# Patient Record
Sex: Male | Born: 1970 | Race: White | Hispanic: No | Marital: Married | Smoking: Never smoker
Health system: Southern US, Community
[De-identification: ages and names within clinical notes are randomized; demographics above are authoritative.]

## PROBLEM LIST (undated history)

## (undated) DIAGNOSIS — U071 COVID-19: Secondary | ICD-10-CM

## (undated) HISTORY — DX: COVID-19: U07.1

---

## 2005-10-31 HISTORY — PX: ERCP W/ SPHINCTEROTOMY AND BALLOON DILATION: SHX1524

## 2006-06-30 ENCOUNTER — Ambulatory Visit (HOSPITAL_COMMUNITY): Admission: RE | Admit: 2006-06-30 | Discharge: 2006-06-30 | Payer: Self-pay | Admitting: Gastroenterology

## 2006-07-17 ENCOUNTER — Ambulatory Visit (HOSPITAL_COMMUNITY): Admission: RE | Admit: 2006-07-17 | Discharge: 2006-07-17 | Payer: Self-pay | Admitting: *Deleted

## 2006-08-11 ENCOUNTER — Ambulatory Visit: Payer: Self-pay | Admitting: Internal Medicine

## 2006-08-28 LAB — CBC WITH DIFFERENTIAL/PLATELET
BASO%: 0.3 % (ref 0.0–2.0)
Basophils Absolute: 0 10*3/uL (ref 0.0–0.1)
Eosinophils Absolute: 0.1 10*3/uL (ref 0.0–0.5)
HCT: 39 % (ref 38.7–49.9)
HGB: 14.2 g/dL (ref 13.0–17.1)
LYMPH%: 21.9 % (ref 14.0–48.0)
MCHC: 36.5 g/dL — ABNORMAL HIGH (ref 32.0–35.9)
MONO#: 0.5 10*3/uL (ref 0.1–0.9)
NEUT%: 69.7 % (ref 40.0–75.0)
Platelets: 162 10*3/uL (ref 145–400)
WBC: 7.4 10*3/uL (ref 4.0–10.0)
lymph#: 1.6 10*3/uL (ref 0.9–3.3)

## 2006-08-29 LAB — COMPREHENSIVE METABOLIC PANEL
ALT: 104 U/L — ABNORMAL HIGH (ref 0–40)
BUN: 8 mg/dL (ref 6–23)
CO2: 27 mEq/L (ref 19–32)
Calcium: 9 mg/dL (ref 8.4–10.5)
Chloride: 102 mEq/L (ref 96–112)
Creatinine, Ser: 0.94 mg/dL (ref 0.40–1.50)
Glucose, Bld: 102 mg/dL — ABNORMAL HIGH (ref 70–99)
Total Bilirubin: 3.4 mg/dL — ABNORMAL HIGH (ref 0.3–1.2)

## 2006-08-29 LAB — IRON AND TIBC
%SAT: 53 % (ref 20–55)
Iron: 147 ug/dL (ref 42–165)
TIBC: 279 ug/dL (ref 215–435)

## 2006-08-29 LAB — LACTATE DEHYDROGENASE: LDH: 196 U/L (ref 94–250)

## 2006-08-29 LAB — FOLATE RBC: RBC Folate: 1104 ng/mL — ABNORMAL HIGH (ref 180–600)

## 2019-09-09 ENCOUNTER — Telehealth: Payer: Self-pay | Admitting: Emergency Medicine

## 2019-09-10 ENCOUNTER — Encounter (HOSPITAL_COMMUNITY): Payer: Self-pay

## 2019-09-10 ENCOUNTER — Other Ambulatory Visit: Payer: Self-pay

## 2019-09-10 ENCOUNTER — Inpatient Hospital Stay (HOSPITAL_COMMUNITY): Payer: HRSA Program

## 2019-09-10 ENCOUNTER — Inpatient Hospital Stay (HOSPITAL_COMMUNITY)
Admission: AD | Admit: 2019-09-10 | Discharge: 2019-09-15 | DRG: 177 | Disposition: A | Discharge status: Home / Self Care | Payer: HRSA Program | Source: Other Acute Inpatient Hospital | Attending: Family Medicine | Admitting: Family Medicine

## 2019-09-10 DIAGNOSIS — R0600 Dyspnea, unspecified: Secondary | ICD-10-CM

## 2019-09-10 DIAGNOSIS — J9601 Acute respiratory failure with hypoxia: Secondary | ICD-10-CM | POA: Diagnosis present

## 2019-09-10 DIAGNOSIS — D580 Hereditary spherocytosis: Secondary | ICD-10-CM | POA: Diagnosis present

## 2019-09-10 DIAGNOSIS — R0603 Acute respiratory distress: Secondary | ICD-10-CM | POA: Diagnosis not present

## 2019-09-10 DIAGNOSIS — I2699 Other pulmonary embolism without acute cor pulmonale: Secondary | ICD-10-CM | POA: Diagnosis not present

## 2019-09-10 DIAGNOSIS — J1289 Other viral pneumonia: Secondary | ICD-10-CM

## 2019-09-10 DIAGNOSIS — U071 COVID-19: Secondary | ICD-10-CM | POA: Diagnosis present

## 2019-09-10 DIAGNOSIS — R739 Hyperglycemia, unspecified: Secondary | ICD-10-CM | POA: Diagnosis present

## 2019-09-10 DIAGNOSIS — J1282 Pneumonia due to coronavirus disease 2019: Secondary | ICD-10-CM | POA: Diagnosis present

## 2019-09-10 DIAGNOSIS — E876 Hypokalemia: Secondary | ICD-10-CM | POA: Diagnosis present

## 2019-09-10 LAB — PROCALCITONIN: Procalcitonin: 0.22 ng/mL

## 2019-09-10 LAB — COMPREHENSIVE METABOLIC PANEL
ALT: 28 U/L (ref 0–44)
AST: 34 U/L (ref 15–41)
Albumin: 3.2 g/dL — ABNORMAL LOW (ref 3.5–5.0)
Alkaline Phosphatase: 75 U/L (ref 38–126)
Anion gap: 10 (ref 5–15)
BUN: 24 mg/dL — ABNORMAL HIGH (ref 6–20)
CO2: 29 mmol/L (ref 22–32)
Calcium: 8.1 mg/dL — ABNORMAL LOW (ref 8.9–10.3)
Chloride: 98 mmol/L (ref 98–111)
Creatinine, Ser: 0.83 mg/dL (ref 0.61–1.24)
GFR calc Af Amer: >60 mL/min (ref >60)
GFR calc non Af Amer: >60 mL/min (ref >60)
Glucose, Bld: 194 mg/dL — ABNORMAL HIGH (ref 70–99)
Potassium: 3.2 mmol/L — ABNORMAL LOW (ref 3.5–5.1)
Sodium: 137 mmol/L (ref 135–145)
Total Bilirubin: 1.5 mg/dL — ABNORMAL HIGH (ref 0.3–1.2)
Total Protein: 6.2 g/dL — ABNORMAL LOW (ref 6.5–8.1)

## 2019-09-10 LAB — CBC WITH DIFFERENTIAL/PLATELET
Abs Immature Granulocytes: 0.83 10*3/uL — ABNORMAL HIGH (ref 0.00–0.07)
Basophils Absolute: 0.1 10*3/uL (ref 0.0–0.1)
Basophils Relative: 0 %
Eosinophils Absolute: 0 10*3/uL (ref 0.0–0.5)
Eosinophils Relative: 0 %
HCT: 33.7 % — ABNORMAL LOW (ref 39.0–52.0)
Hemoglobin: 11.5 g/dL — ABNORMAL LOW (ref 13.0–17.0)
Immature Granulocytes: 4 %
Lymphocytes Relative: 6 %
Lymphs Abs: 1.2 10*3/uL (ref 0.7–4.0)
MCH: 31.7 pg (ref 26.0–34.0)
MCHC: 34.1 g/dL (ref 30.0–36.0)
MCV: 92.8 fL (ref 80.0–100.0)
Monocytes Absolute: 0.8 10*3/uL (ref 0.1–1.0)
Monocytes Relative: 4 %
Neutro Abs: 18.2 10*3/uL — ABNORMAL HIGH (ref 1.7–7.7)
Neutrophils Relative %: 86 %
Platelets: 278 10*3/uL (ref 150–400)
RBC: 3.63 MIL/uL — ABNORMAL LOW (ref 4.22–5.81)
RDW: 15.7 % — ABNORMAL HIGH (ref 11.5–15.5)
WBC: 21.1 10*3/uL — ABNORMAL HIGH (ref 4.0–10.5)
nRBC: 1.5 % — ABNORMAL HIGH (ref 0.0–0.2)

## 2019-09-10 LAB — HIV ANTIBODY (ROUTINE TESTING W REFLEX): HIV Screen 4th Generation wRfx: NONREACTIVE

## 2019-09-10 LAB — MRSA PCR SCREENING: MRSA by PCR: NEGATIVE

## 2019-09-10 LAB — ABO/RH: ABO/RH(D): O POS

## 2019-09-10 MED ORDER — ACETAMINOPHEN 650 MG RE SUPP: 650.0000 mg | Freq: Four times a day (QID) | RECTAL | Status: DC | PRN

## 2019-09-10 MED ORDER — CHLORHEXIDINE GLUCONATE CLOTH 2 % EX PADS
6.0000 | MEDICATED_PAD | Freq: Every day | CUTANEOUS | Status: DC
  Administered 2019-09-10 – 2019-09-15 (×5): 6 via TOPICAL

## 2019-09-10 MED ORDER — ONDANSETRON HCL 4 MG/2ML IJ SOLN: 4.0000 mg | Freq: Four times a day (QID) | INTRAMUSCULAR | Status: DC | PRN

## 2019-09-10 MED ORDER — ONDANSETRON HCL 4 MG PO TABS: 4.0000 mg | ORAL_TABLET | Freq: Four times a day (QID) | ORAL | Status: DC | PRN

## 2019-09-10 MED ORDER — ACETAMINOPHEN 325 MG PO TABS: 650.0000 mg | ORAL_TABLET | Freq: Four times a day (QID) | ORAL | Status: DC | PRN

## 2019-09-10 MED ORDER — ENOXAPARIN SODIUM 30 MG/0.3ML ~~LOC~~ SOLN: 30.0000 mg | SUBCUTANEOUS | Status: DC

## 2019-09-10 MED ORDER — SODIUM CHLORIDE 0.9 % IV SOLN
100.0000 mg | INTRAVENOUS | Status: AC
  Administered 2019-09-11 – 2019-09-13 (×3): 100 mg via INTRAVENOUS
  Filled 2019-09-10: qty 100
  Filled 2019-09-10 (×2): qty 20

## 2019-09-10 MED ORDER — HYDROCOD POLST-CPM POLST ER 10-8 MG/5ML PO SUER
5.0000 mL | Freq: Two times a day (BID) | ORAL | Status: DC
  Administered 2019-09-10 – 2019-09-11 (×2): 5 mL via ORAL
  Filled 2019-09-10 (×5): qty 5

## 2019-09-10 MED ORDER — GUAIFENESIN-DM 100-10 MG/5ML PO SYRP
10.0000 mL | ORAL_SOLUTION | ORAL | Status: DC | PRN
  Administered 2019-09-11 (×2): 10 mL via ORAL
  Filled 2019-09-10 (×2): qty 10

## 2019-09-10 MED ORDER — DEXAMETHASONE SODIUM PHOSPHATE 10 MG/ML IJ SOLN
6.0000 mg | INTRAMUSCULAR | Status: DC
  Administered 2019-09-10 – 2019-09-14 (×5): 6 mg via INTRAVENOUS
  Filled 2019-09-10 (×6): qty 1

## 2019-09-10 MED ORDER — AZITHROMYCIN 500 MG PO TABS
500.0000 mg | ORAL_TABLET | Freq: Every day | ORAL | Status: DC
  Administered 2019-09-10: 500 mg via ORAL
  Filled 2019-09-10 (×2): qty 1

## 2019-09-10 MED ORDER — SODIUM CHLORIDE 0.9 % IV SOLN
100.0000 mg | Freq: Once | INTRAVENOUS | Status: AC
  Administered 2019-09-10: 100 mg via INTRAVENOUS
  Filled 2019-09-10: qty 20

## 2019-09-10 MED ORDER — SODIUM CHLORIDE 0.9 % IV SOLN
2.0000 g | INTRAVENOUS | Status: DC
  Administered 2019-09-10: 2 g via INTRAVENOUS
  Filled 2019-09-10: qty 20

## 2019-09-10 MED ORDER — ZINC SULFATE 220 (50 ZN) MG PO CAPS
220.0000 mg | ORAL_CAPSULE | Freq: Every day | ORAL | Status: DC
  Administered 2019-09-10 – 2019-09-15 (×6): 220 mg via ORAL
  Filled 2019-09-10 (×6): qty 1

## 2019-09-10 MED ORDER — VITAMIN C 500 MG PO TABS
500.0000 mg | ORAL_TABLET | Freq: Every day | ORAL | Status: DC
  Administered 2019-09-10 – 2019-09-15 (×6): 500 mg via ORAL
  Filled 2019-09-10 (×6): qty 1

## 2019-09-10 NOTE — Progress Notes (Signed)
Pt was tx from [place] for [place]. He received his loading dose of remdesivir there yesterday at 1500. D/w Dr. Cathlean Sauer so that we can continue it here.   Baseline ALT 29 11/9 Baseline scr 0.7 11/9  Continue remdesivir 100mg  IV q24 x4 F/u ALT   Onnie Boer, PharmD, BCIDP, AAHIVP, CPP Infectious Disease Pharmacist 09/10/2019 3:37 PM

## 2019-09-10 NOTE — Progress Notes (Signed)
Noted procalcitonin 0.22, will dc antibiotic therapy.

## 2019-09-10 NOTE — H&P (Signed)
History and Physical    Calvin Arnold DOB: 1971/08/14 DOA: 09/10/2019  PCP: No primary care provider on file.   Patient coming from: Home/ transfer from [facility]  Chief Complaint:  Dyspnea.   HPI: Calvin Arnold is a 48 y.o. male  who as no significant past medical history. He presented to [facility] due to persistent dyspnea and not feeling well.  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 He is married and has no children.  On October 29 he developed a sore throat and severe cough which have been persistent, his symptoms progressed to dyspnea, malaise, poor appetite and generalized weakness.  He has been experiencing fevers every day.  His dyspnea was initially mainly on exertion, and progressed to be with minimal efforts, it has been associated with cough, no improving, and no worsening factors.  He tested positive for COVID-19 November 3 and he was advised to stay at home and self quarantine.  Due to persistent symptoms he presented to the hospital for further evaluation.  ED Course: Patient was evaluated at [facility] emergency department, his oxygen saturation in triage was 50%, he was tachycardic, febrile and tachypneic with a respiratory rate of 40 to 44/min.  He was placed on nonrebreather mask 100% FiO2 with improvement of oxygen saturation up to 90%.  He tested positive for COVID-19.  He received dexamethasone, remdesivir and antibiotics.  He has been transferred to [facility] for further management.  Review of Systems:  1. General: positive fever, chills, decreased appetite, generalized malaise. 2. ENT: Positive sore throat, no hearing disturbances 3. Pulmonary: Positive cough and dyspnea as mentioned in history of present illness, no wheezing, or hemoptysis 4. Cardiovascular: No angina, claudication, lower extremity edema, pnd or orthopnea 5. Gastrointestinal: No nausea or vomiting, no diarrhea or constipation 6.  Hematology: No easy bruisability or frequent infections 7. Urology: No dysuria, hematuria or increased urinary frequency 8. Dermatology: No rashes. 9. Neurology: No seizures or paresthesias 10. Musculoskeletal: No joint pain or deformities  No significant past medical history.   has no history on file for tobacco, alcohol, and drug.  No Known Allergies  Positive family history for heart disease in his grandfather.  Prior to Admission medications   Not on File    Physical Exam: Vitals:   09/10/19 1500 09/10/19 1515 09/10/19 1530 09/10/19 1600  BP: 125/76 124/73 135/71 127/75  Pulse: 84 82 77 76  Resp: (!) 31 (!) 34 (!) 26 (!) 32  Temp: 97.7 F (36.5 C)     TempSrc: Axillary     SpO2: 100% 100% 96% 98%  Weight: 71.8 kg     Height: 5' 7.5" (1.715 m)       Vitals:   09/10/19 1500 09/10/19 1515 09/10/19 1530 09/10/19 1600  BP: 125/76 124/73 135/71 127/75  Pulse: 84 82 77 76  Resp: (!) 31 (!) 34 (!) 26 (!) 32  Temp: 97.7 F (36.5 C)     TempSrc: Axillary     SpO2: 100% 100% 96% 98%  Weight: 71.8 kg     Height: 5' 7.5" (1.715 m)      General: positive dyspnea, deconditioned  Neurology: Awake and alert, non focal Head and Neck. Head normocephalic. Neck supple with no adenopathy or thyromegaly.   E ENI:DPOE pallor, no icterus, oral mucosa moist Cardiovascular: No JVD. S1-S2 present, rhythmic, no gallops, rubs, or murmurs. No lower extremity edema. Pulmonary: positive breath sounds bilaterally, no wheezing, rhonchi  or rales. Gastrointestinal. Abdomen with no organomegaly, non tender, no rebound or guarding Skin. No rashes Musculoskeletal: no joint deformities    Labs on Admission: I have personally reviewed following labs and imaging studies  CBC: No results for input(s): WBC, NEUTROABS, HGB, HCT, MCV, PLT in the last 168 hours. Basic Metabolic Panel: No results for input(s): NA, K, CL, CO2, GLUCOSE, BUN, CREATININE, CALCIUM, MG, PHOS in the last 168 hours. GFR:  CrCl cannot be calculated (Patient's most recent lab result is older than the maximum 21 days allowed.). Liver Function Tests: No results for input(s): AST, ALT, ALKPHOS, BILITOT, PROT, ALBUMIN in the last 168 hours. No results for input(s): LIPASE, AMYLASE in the last 168 hours. No results for input(s): AMMONIA in the last 168 hours. Coagulation Profile: No results for input(s): INR, PROTIME in the last 168 hours. Cardiac Enzymes: No results for input(s): CKTOTAL, CKMB, CKMBINDEX, TROPONINI in the last 168 hours. BNP (last 3 results) No results for input(s): PROBNP in the last 8760 hours. HbA1C: No results for input(s): HGBA1C in the last 72 hours. CBG: No results for input(s): GLUCAP in the last 168 hours. Lipid Profile: No results for input(s): CHOL, HDL, LDLCALC, TRIG, CHOLHDL, LDLDIRECT in the last 72 hours. Thyroid Function Tests: No results for input(s): TSH, T4TOTAL, FREET4, T3FREE, THYROIDAB in the last 72 hours. Anemia Panel: No results for input(s): VITAMINB12, FOLATE, FERRITIN, TIBC, IRON, RETICCTPCT in the last 72 hours. Urine analysis: No results found for: COLORURINE, APPEARANCEUR, LABSPEC, PHURINE, GLUCOSEU, HGBUR, BILIRUBINUR, KETONESUR, PROTEINUR, UROBILINOGEN, NITRITE, LEUKOCYTESUR  Radiological Exams on Admission: Dg Chest 1 View  Result Date: 09/10/2019 CLINICAL DATA:  Dyspnea EXAM: CHEST  1 VIEW COMPARISON:  September 09, 2019 FINDINGS: Multifocal airspace opacities are again noted, slightly improved from prior study. There is no pneumothorax. No large pleural effusion. The heart size is stable from prior study. IMPRESSION: Persistent but slightly improved multifocal airspace opacities. Electronically Signed   By: Katherine Mantle M.D.   On: 09/10/2019 16:03    EKG: Independently reviewed.   Assessment/Plan Active Problems:   Pneumonia due to COVID-74 virus  48 year old male with no significant past medical history who presents with worsening viral syndrome  for the last 12 days, consistent with dyspnea, cough, fevers, malaise and poor appetite.  When he was initially evaluated in [facility] emergency department he was in acute respiratory distress with a respiratory rate in the 40s and oxygen saturation in the 50s on room air.  At the time of transfer his blood pressure is 127/75, heart rate 76, respiratory rate 26-32, oxygen saturation 96 to 98% on a heated high flow nasal cannula 25 L/min, FiO2 of 70%.  His lungs had no wheezing or rails, heart S1-S2 present rhythm, soft abdomen, no lower extremity edema. Labs from [place], white count 25.8, hemoglobin 12.6, D-dimer 2446, lactic acid 2.4, AST 63, LDH 3000 157, CRP 171, procalcitonin 0.63 chest radiograph with multifocal infiltrates, interstitial alveolar right lower lobe, interstitial right upper lobe and left lower lobe.  Patient has been admitted to the intensive care unit with a working diagnosis of acute hypoxic respiratory failure due to SARS COVID-19 viral pneumonia.  1.  Acute hypoxic respiratory failure due to SARS COVID-19 viral pneumonia.  Continue supplemental oxygen with heated high flow nasal cannula, target oxygen saturation greater than 88%.  Continue medical therapy with remdesivir and systemic corticosteroids (dexamethasone 6 mg intravenously every 24 hours).  Continue follow-up on inflammatory markers, ferritin, CRP and D-dimer.    Certainly his inflammatory  markers are very elevated, will continue close observation during next 24 hours, if signs of worsening hypoxemia he will be candidate for Actemra and convalescent plasma.  His procalcitonin is elevated, he has [facility].  For now we will continue antibiotic therapy with ceftriaxone and azithromycin.  Will continue follow-up procalcitonin in 24 hours, possible rapid de-escalation of antibiotics.  Add antitussive agents, bronchodilators, and encourage prone positioning.  Airway clearance techniques  with flutter valve and incentive spirometer.  DVT prophylaxis with enoxaparin.  2.  Leukocytosis.  Likely reactive, possibly steroid-induced, continue close follow-up on cell count.    DVT prophylaxis:  enoxparin  Code Status:  full  Family Communication: no family at the bedside   Disposition Plan:  ICU.    Consults called:  None   Admission status: Inpatient.    Loring Liskey Annett Gulaaniel Roarke Marciano MD [facility]   09/10/2019, 4:09 PM

## 2019-09-11 DIAGNOSIS — J9601 Acute respiratory failure with hypoxia: Secondary | ICD-10-CM

## 2019-09-11 LAB — COMPREHENSIVE METABOLIC PANEL
ALT: 23 U/L (ref 0–44)
AST: 26 U/L (ref 15–41)
Albumin: 3.1 g/dL — ABNORMAL LOW (ref 3.5–5.0)
Alkaline Phosphatase: 71 U/L (ref 38–126)
Anion gap: 8 (ref 5–15)
BUN: 24 mg/dL — ABNORMAL HIGH (ref 6–20)
CO2: 29 mmol/L (ref 22–32)
Calcium: 7.8 mg/dL — ABNORMAL LOW (ref 8.9–10.3)
Chloride: 98 mmol/L (ref 98–111)
Creatinine, Ser: 0.73 mg/dL (ref 0.61–1.24)
GFR calc Af Amer: >60 mL/min (ref >60)
GFR calc non Af Amer: >60 mL/min (ref >60)
Glucose, Bld: 210 mg/dL — ABNORMAL HIGH (ref 70–99)
Potassium: 3.2 mmol/L — ABNORMAL LOW (ref 3.5–5.1)
Sodium: 135 mmol/L (ref 135–145)
Total Bilirubin: 1.7 mg/dL — ABNORMAL HIGH (ref 0.3–1.2)
Total Protein: 5.5 g/dL — ABNORMAL LOW (ref 6.5–8.1)

## 2019-09-11 LAB — CBC WITH DIFFERENTIAL/PLATELET
Abs Immature Granulocytes: 0.85 10*3/uL — ABNORMAL HIGH (ref 0.00–0.07)
Basophils Absolute: 0.1 10*3/uL (ref 0.0–0.1)
Basophils Relative: 0 %
Eosinophils Absolute: 0 10*3/uL (ref 0.0–0.5)
Eosinophils Relative: 0 %
HCT: 32.5 % — ABNORMAL LOW (ref 39.0–52.0)
Hemoglobin: 10.9 g/dL — ABNORMAL LOW (ref 13.0–17.0)
Immature Granulocytes: 4 %
Lymphocytes Relative: 5 %
Lymphs Abs: 1.2 10*3/uL (ref 0.7–4.0)
MCH: 31.6 pg (ref 26.0–34.0)
MCHC: 33.5 g/dL (ref 30.0–36.0)
MCV: 94.2 fL (ref 80.0–100.0)
Monocytes Absolute: 1.1 10*3/uL — ABNORMAL HIGH (ref 0.1–1.0)
Monocytes Relative: 4 %
Neutro Abs: 21.1 10*3/uL — ABNORMAL HIGH (ref 1.7–7.7)
Neutrophils Relative %: 87 %
Platelets: 255 10*3/uL (ref 150–400)
RBC: 3.45 MIL/uL — ABNORMAL LOW (ref 4.22–5.81)
RDW: 16.2 % — ABNORMAL HIGH (ref 11.5–15.5)
WBC: 24.3 10*3/uL — ABNORMAL HIGH (ref 4.0–10.5)
nRBC: 1.3 % — ABNORMAL HIGH (ref 0.0–0.2)

## 2019-09-11 LAB — C-REACTIVE PROTEIN: CRP: 4.6 mg/dL — ABNORMAL HIGH (ref <1.0)

## 2019-09-11 LAB — FERRITIN: Ferritin: 736 ng/mL — ABNORMAL HIGH (ref 24–336)

## 2019-09-11 LAB — D-DIMER, QUANTITATIVE: D-Dimer, Quant: 4.87 ug/mL-FEU — ABNORMAL HIGH (ref 0.00–0.50)

## 2019-09-11 LAB — PROCALCITONIN: Procalcitonin: 0.22 ng/mL

## 2019-09-11 MED ORDER — POTASSIUM CHLORIDE 20 MEQ PO PACK
40.0000 meq | PACK | Freq: Two times a day (BID) | ORAL | Status: DC
  Administered 2019-09-11: 40 meq via ORAL
  Filled 2019-09-11 (×3): qty 2

## 2019-09-11 MED ORDER — POTASSIUM CHLORIDE 20 MEQ/15ML (10%) PO SOLN
40.0000 meq | Freq: Two times a day (BID) | ORAL | Status: AC
  Administered 2019-09-11 – 2019-09-12 (×2): 40 meq via ORAL
  Filled 2019-09-11 (×2): qty 30

## 2019-09-11 MED ORDER — ORAL CARE MOUTH RINSE
15.0000 mL | Freq: Two times a day (BID) | OROMUCOSAL | Status: DC
  Administered 2019-09-11 – 2019-09-15 (×8): 15 mL via OROMUCOSAL

## 2019-09-11 MED ORDER — WITCH HAZEL-GLYCERIN EX PADS
MEDICATED_PAD | CUTANEOUS | Status: DC | PRN
  Administered 2019-09-12: 1 via TOPICAL
  Filled 2019-09-11: qty 100

## 2019-09-11 MED ORDER — ENOXAPARIN SODIUM 40 MG/0.4ML ~~LOC~~ SOLN
40.0000 mg | Freq: Two times a day (BID) | SUBCUTANEOUS | Status: DC
  Administered 2019-09-11 – 2019-09-12 (×2): 40 mg via SUBCUTANEOUS
  Filled 2019-09-11 (×2): qty 0.4

## 2019-09-11 MED ORDER — ENOXAPARIN SODIUM 40 MG/0.4ML ~~LOC~~ SOLN
40.0000 mg | SUBCUTANEOUS | Status: DC
Start: 2019-09-11 — End: 2019-09-11

## 2019-09-11 MED ORDER — POTASSIUM CHLORIDE CRYS ER 20 MEQ PO TBCR
40.0000 meq | EXTENDED_RELEASE_TABLET | Freq: Two times a day (BID) | ORAL | Status: DC
  Filled 2019-09-11: qty 2

## 2019-09-11 NOTE — Progress Notes (Signed)
Called and gave report to [place], Therapist, sports. Transported patient to [facility].

## 2019-09-11 NOTE — Progress Notes (Signed)
NAME:  Calvin Arnold, MRN:  [id number], DOB:  [birth year], LOS: 1 ADMISSION DATE:  09/10/2019, CONSULTATION DATE:  09/11/2019 REFERRING MD:  Ella Jubilee -TRH, CHIEF COMPLAINT:  Hypoxia.   HPI/course in hospital  48 year old previously healthy man who was transferred to ICU for increasing hypoxia.  October 29 he developed a sore throat and severe cough which have been persistent, his symptoms progressed to dyspnea, malaise, poor appetite and generalized weakness.  He has been experiencing fevers every day.  His dyspnea was initially mainly on exertion, and progressed to be with minimal efforts, it has been associated with cough, no improving, and no worsening factors.  He tested positive on 11/02, but presented to [facility] 11/10 with increasing dyspnea. He was then started on remdesivir, dexamethasone, and CAP coverage which was later stopped due to low PCT.  Past Medical History  Negative    Interim history/subjective:  Denies significant dyspnea at present. Continues to have coughing fits.  Objective   Blood pressure 132/70, pulse 83, temperature 97.9 F (36.6 C), temperature source Oral, resp. rate (!) 26, height 5' 7.5" (1.715 m), weight 71.8 kg, SpO2 97 %.    FiO2 (%):  [60 %-80 %] 60 %   Intake/Output Summary (Last 24 hours) at 09/11/2019 1343 Last data filed at 09/11/2019 1200 Gross per 24 hour  Intake 730 ml  Output 1200 ml  Net -470 ml   Filed Weights   09/10/19 1500  Weight: 71.8 kg    Examination: Physical Exam Constitutional:      General: He is not in acute distress.    Appearance: He is not ill-appearing.     Comments: Thin  HENT:     Mouth/Throat:     Mouth: Mucous membranes are moist.  Eyes:     Conjunctiva/sclera: Conjunctivae normal.  Cardiovascular:     Rate and Rhythm: Regular rhythm.  Pulmonary:     Effort: Pulmonary effort is normal.     Breath sounds: No rales.     Comments: No accessory muscle use Abdominal:     General: Abdomen is flat.   Palpations: Abdomen is soft.  Genitourinary:    Comments: No Foley Musculoskeletal:     Right lower leg: No edema.     Left lower leg: No edema.  Skin:    General: Skin is warm and dry.  Neurological:     General: No focal deficit present.     Mental Status: He is alert.     Motor: No weakness.      Ancillary tests (personally reviewed)  CBC: Recent Labs  Lab 09/10/19 1634 09/11/19 0618  WBC 21.1* 24.3*  NEUTROABS 18.2* 21.1*  HGB 11.5* 10.9*  HCT 33.7* 32.5*  MCV 92.8 94.2  PLT 278 255    Basic Metabolic Panel: Recent Labs  Lab 09/10/19 1634 09/11/19 0618  NA 137 135  K 3.2* 3.2*  CL 98 98  CO2 29 29  GLUCOSE 194* 210*  BUN 24* 24*  CREATININE 0.83 0.73  CALCIUM 8.1* 7.8*   GFR: Estimated Creatinine Clearance: 108.7 mL/min (by C-G formula based on SCr of 0.73 mg/dL). Recent Labs  Lab 09/10/19 1634 09/11/19 0618  PROCALCITON 0.22 0.22  WBC 21.1* 24.3*    Liver Function Tests: Recent Labs  Lab 09/10/19 1634 09/11/19 0618  AST 34 26  ALT 28 23  ALKPHOS 75 71  BILITOT 1.5* 1.7*  PROT 6.2* 5.5*  ALBUMIN 3.2* 3.1*   No results for input(s): LIPASE, AMYLASE in the  last 168 hours. No results for input(s): AMMONIA in the last 168 hours.  ABG No results found for: PHART, PCO2ART, PO2ART, HCO3, TCO2, ACIDBASEDEF, O2SAT   Coagulation Profile: No results for input(s): INR, PROTIME in the last 168 hours.  Cardiac Enzymes: No results for input(s): CKTOTAL, CKMB, CKMBINDEX, TROPONINI in the last 168 hours.  HbA1C: No results found for: HGBA1C  CBG: No results for input(s): GLUCAP in the last 168 hours.   Assessment & Plan:   Critically ill due acute hypoxic respiratory failure requiring Heated High flow at high rate. Despite appearing comfortable, he remains at high risk for deterioration requiring mechanical ventilation.  - Continue to titrate HFNC as tolerated. -Encourage I/S and ambulation to chair as tolerated.    COVID 19 Pneumonia  Severe disease. - Continue currently plan courses of dexamethasone and remdesivir  Daily Goals Checklist  Pain/Anxiety/Delirium protocol (if indicated): Acetaminophen only VAP protocol (if indicated): Not intubated Respiratory support goals: Wean heated high flow as tolerated to oxygen saturation greater than 88% Blood pressure target: Allow to autoregulate DVT prophylaxis: Lovenox 40 mg daily Nutritional status and feeding goals: Well-nourished, oral intake ad lib. GI prophylaxis: Not indicated Fluid status goals: Allow autoregulation Urinary catheter: None present Central lines: Peripheral IVs only Glucose control: Euglycemic with no Mobility/therapy needs: None identified Antibiotic de-escalation: Continue remdesivir 5 days, dexamethasone 10 days. Home medication reconciliation: On no home medication Daily labs: None required Code Status: Full code Family Communication: Patient updated, he will will communicate with wife Disposition: ICU until off heated high flow.  CRITICAL CARE Performed by: Kipp Brood   Total critical care time: 35 minutes  Critical care time was exclusive of separately billable procedures and treating other patients.  Critical care was necessary to treat or prevent imminent or life-threatening deterioration.  Critical care was time spent personally by me on the following activities: development of treatment plan with patient and/or surrogate as well as nursing, discussions with consultants, evaluation of patient's response to treatment, examination of patient, obtaining history from patient or surrogate, ordering and performing treatments and interventions, ordering and review of laboratory studies, ordering and review of radiographic studies, pulse oximetry, re-evaluation of patient's condition and participation in multidisciplinary rounds.  Kipp Brood, MD [facility] ICU Physician [place]  Pager: [phone] Mobile: [phone]  After hours: [phone].    09/11/2019, 1:43 PM

## 2019-09-11 NOTE — Progress Notes (Signed)
Asked patient if he wanted me to update his wife on his condition and care. He stated that it would not be necessary to call her. He said that he was talking to her throughout the day and updating her on how he was doing.

## 2019-09-12 ENCOUNTER — Inpatient Hospital Stay (HOSPITAL_COMMUNITY): Payer: Self-pay

## 2019-09-12 ENCOUNTER — Inpatient Hospital Stay (HOSPITAL_COMMUNITY): Payer: HRSA Program

## 2019-09-12 ENCOUNTER — Encounter (HOSPITAL_COMMUNITY): Payer: Self-pay | Admitting: Family Medicine

## 2019-09-12 DIAGNOSIS — I517 Cardiomegaly: Secondary | ICD-10-CM

## 2019-09-12 DIAGNOSIS — I2699 Other pulmonary embolism without acute cor pulmonale: Secondary | ICD-10-CM | POA: Diagnosis not present

## 2019-09-12 DIAGNOSIS — E876 Hypokalemia: Secondary | ICD-10-CM | POA: Diagnosis present

## 2019-09-12 LAB — D-DIMER, QUANTITATIVE: D-Dimer, Quant: 7.04 ug/mL-FEU — ABNORMAL HIGH (ref 0.00–0.50)

## 2019-09-12 LAB — CBC WITH DIFFERENTIAL/PLATELET
Abs Immature Granulocytes: 1 10*3/uL — ABNORMAL HIGH (ref 0.00–0.07)
Basophils Absolute: 0.1 10*3/uL (ref 0.0–0.1)
Basophils Relative: 0 %
Eosinophils Absolute: 0 10*3/uL (ref 0.0–0.5)
Eosinophils Relative: 0 %
HCT: 35.7 % — ABNORMAL LOW (ref 39.0–52.0)
Hemoglobin: 11.7 g/dL — ABNORMAL LOW (ref 13.0–17.0)
Immature Granulocytes: 4 %
Lymphocytes Relative: 4 %
Lymphs Abs: 1.1 10*3/uL (ref 0.7–4.0)
MCH: 31.7 pg (ref 26.0–34.0)
MCHC: 32.8 g/dL (ref 30.0–36.0)
MCV: 96.7 fL (ref 80.0–100.0)
Monocytes Absolute: 1 10*3/uL (ref 0.1–1.0)
Monocytes Relative: 4 %
Neutro Abs: 22.6 10*3/uL — ABNORMAL HIGH (ref 1.7–7.7)
Neutrophils Relative %: 88 %
Platelets: 204 10*3/uL (ref 150–400)
RBC: 3.69 MIL/uL — ABNORMAL LOW (ref 4.22–5.81)
RDW: 17.9 % — ABNORMAL HIGH (ref 11.5–15.5)
WBC: 25.7 10*3/uL — ABNORMAL HIGH (ref 4.0–10.5)
nRBC: 1.4 % — ABNORMAL HIGH (ref 0.0–0.2)

## 2019-09-12 LAB — COMPREHENSIVE METABOLIC PANEL
ALT: 28 U/L (ref 0–44)
AST: 31 U/L (ref 15–41)
Albumin: 3.1 g/dL — ABNORMAL LOW (ref 3.5–5.0)
Alkaline Phosphatase: 76 U/L (ref 38–126)
Anion gap: 11 (ref 5–15)
BUN: 24 mg/dL — ABNORMAL HIGH (ref 6–20)
CO2: 27 mmol/L (ref 22–32)
Calcium: 7.9 mg/dL — ABNORMAL LOW (ref 8.9–10.3)
Chloride: 95 mmol/L — ABNORMAL LOW (ref 98–111)
Creatinine, Ser: 0.85 mg/dL (ref 0.61–1.24)
GFR calc Af Amer: >60 mL/min (ref >60)
GFR calc non Af Amer: >60 mL/min (ref >60)
Glucose, Bld: 116 mg/dL — ABNORMAL HIGH (ref 70–99)
Potassium: 4.2 mmol/L (ref 3.5–5.1)
Sodium: 133 mmol/L — ABNORMAL LOW (ref 135–145)
Total Bilirubin: 1.3 mg/dL — ABNORMAL HIGH (ref 0.3–1.2)
Total Protein: 5.7 g/dL — ABNORMAL LOW (ref 6.5–8.1)

## 2019-09-12 LAB — ECHOCARDIOGRAM LIMITED
Height: 67.5 in
Weight: 2532.64 oz

## 2019-09-12 LAB — PROCALCITONIN: Procalcitonin: 0.27 ng/mL

## 2019-09-12 LAB — GLUCOSE, CAPILLARY
Glucose-Capillary: 88 mg/dL (ref 70–99)
Glucose-Capillary: 101 mg/dL — ABNORMAL HIGH (ref 70–99)

## 2019-09-12 LAB — C-REACTIVE PROTEIN: CRP: 3.3 mg/dL — ABNORMAL HIGH (ref <1.0)

## 2019-09-12 LAB — FERRITIN: Ferritin: 578 ng/mL — ABNORMAL HIGH (ref 24–336)

## 2019-09-12 MED ORDER — INSULIN ASPART 100 UNIT/ML ~~LOC~~ SOLN
0.0000 [IU] | Freq: Three times a day (TID) | SUBCUTANEOUS | Status: DC
  Administered 2019-09-12: 1 [IU] via SUBCUTANEOUS

## 2019-09-12 MED ORDER — IOHEXOL 350 MG/ML SOLN
75.0000 mL | Freq: Once | INTRAVENOUS | Status: AC | PRN
  Administered 2019-09-12: 75 mL via INTRAVENOUS

## 2019-09-12 MED ORDER — ENOXAPARIN SODIUM 30 MG/0.3ML ~~LOC~~ SOLN
30.0000 mg | SUBCUTANEOUS | Status: AC
  Administered 2019-09-12: 30 mg via SUBCUTANEOUS
  Filled 2019-09-12: qty 0.3

## 2019-09-12 MED ORDER — ENOXAPARIN SODIUM 80 MG/0.8ML ~~LOC~~ SOLN
1.0000 mg/kg | Freq: Two times a day (BID) | SUBCUTANEOUS | Status: DC
  Administered 2019-09-12 – 2019-09-14 (×5): 70 mg via SUBCUTANEOUS
  Filled 2019-09-12 (×5): qty 0.8

## 2019-09-12 MED ORDER — INSULIN ASPART 100 UNIT/ML ~~LOC~~ SOLN: 0.0000 [IU] | Freq: Every day | SUBCUTANEOUS | Status: DC

## 2019-09-12 NOTE — Progress Notes (Signed)
Spouse called, no answer, message left.

## 2019-09-12 NOTE — Progress Notes (Signed)
ANTICOAGULATION CONSULT NOTE - Initial Consult  Pharmacy Consult for Lovenox Indication: pulmonary embolus  Allergies  Allergen Reactions  . Onion Other (See Comments)    GI bloating    Patient Measurements: Height: 5' 7.5" (171.5 cm) Weight: 158 lb 4.6 oz (71.8 kg) IBW/kg (Calculated) : 67.25  Vital Signs: Temp: 98 F (36.7 C) (11/12 1105) Temp Source: Oral (11/12 1105) BP: 136/70 (11/12 1105) Pulse Rate: 92 (11/12 1105)  Labs: Recent Labs    09/10/19 1634 09/11/19 0618 09/12/19 0010  HGB 11.5* 10.9* 11.7*  HCT 33.7* 32.5* 35.7*  PLT 278 255 204  CREATININE 0.83 0.73 0.85    Estimated Creatinine Clearance: 102.3 mL/min (by C-G formula based on SCr of 0.85 mg/dL).   Medical History: History reviewed. No pertinent past medical history.  Medications:  Medications Prior to Admission  Medication Sig Dispense Refill Last Dose  . acetaminophen (TYLENOL) 500 MG tablet Take 1,000 mg by mouth every 6 (six) hours as needed for mild pain or fever.   09/09/2019  . benzonatate (TESSALON) 200 MG capsule Take 200 mg by mouth 3 (three) times daily as needed.   09/09/2019  . ibuprofen (ADVIL) 200 MG tablet Take 400 mg by mouth every 6 (six) hours as needed for fever or moderate pain.   09/09/2019  . vitamin C (ASCORBIC ACID) 500 MG tablet Take 500 mg by mouth daily.   09/09/2019    Assessment: 89 YOM admitted with COVID-19 now diagnosed acute pulmonary embolism with mild heart strain. Patient is currently on prophylactic dose Lovenox. Pharmacy consulted to transition patient to treatment dose Lovenox.   H/H low stable, Plt wnl. SCr wnl. Of note, Lovenox 40 mg this AM at 0806  Goal of Therapy:  Anti-Xa level 0.6-1 units/ml 4hrs after LMWH dose given Monitor platelets by anticoagulation protocol: Yes   Plan:  -Start Lovenox 70 mg (1 mg/kg) SQ twice daily. Will give Lovenox 30 mg now in addition to earlier dose -Monitor CBC, renal fx, and s/s of bleeding -F/u transition to oral  anticoagulation   Albertina Parr, PharmD., BCPS Clinical Pharmacist Clinical phone for 09/12/19 until 5pm: [phone]

## 2019-09-12 NOTE — Progress Notes (Signed)
PROGRESS NOTE  Calvin Arnold  LKG:[id number] DOB: Jul 19, 1971 DOA: 09/10/2019 PCP: Patient, No Pcp Per  Brief Narrative: Calvin Arnold is a 48 y.o. male previously healthy male who presented to the [place] on 11/10 with progressive shortness of breath, fatigue, poor per oral intake, and daily fevers after having tested positive for SARS-CoV-2 on 11/3. He was given 15L NRB supplemental oxygen, remdesivir, and decadron. Antibiotics initially given but stopped with low PCT. Admission was requested at [facility] and he was taken to the ICU requiring HHF O2, though with treatment was able to be weaned to 15L HFNC and transferred to PCU on 11/11. D-dimer was elevated and rising, prompting CTA chest which demonstrated segmental/subsegmental LLL pulmonary emboli with RV:LV 1.1 and confirmed diffuse significant GGOs consistent with severe covid pneumonia.  Assessment & Plan: Principal Problem:   Pneumonia due to COVID-19 virus Active Problems:   Respiratory failure ([place])  Acute hypoxic respiratory failure due to GUYQI-34 pneumonia complicated by PE:  - Wean oxygen as tolerated, making gains today.  - Continue remdesivir 11/10 - 11/14 - continue steroids, CRP improving. - Continue airborne, contact precautions. PPE including surgical gown, gloves, cap, shoe covers, and CAPR used during this encounter in a negative pressure room.  - Check daily labs: CBC w/diff, CMP, d-dimer, ferritin, CRP - Maintain euvolemia/net negative.  - Avoid NSAIDs - Recommend proning and aggressive use of incentive spirometry. Unable to prone.   Acute pulmonary embolism:  - Start therapeutic-dose lovenox. Has trouble taking pills, so will delay transition to [place] until discharge/improved respiratory status. - Check echocardiogram given RV/LV 1.1.   Hypokalemia: Resolved with replacement.  - Continue monitoring  Hyperbilirubinemia: Without other LFT elevations, mild. ?Gilbert's.  - Check fractionated bilirubin in AM  Neutrophilic  leukocytosis: Possibly due to steroids. PCT not severely elevated, more consistent with covid infection.  - Monitor.   Disposition Plan: Home once hypoxia improves and remdesivir completed.  Consultants:   None. Spoke with radiology, Dr. Miachel Roux by phone 11/12.  Procedures:   Echocardiogram pending  Antimicrobials:  Remdesivir 11/10 - 11/14   Ceftriaxone, azithromycin 11/10  Subjective: Breathing better today than yesterday. Denies chest pain, leg swelling, personal or family history of blood clots. When returning to discuss CTA results, reports he is beginning to have some pleuritic chest pain that is mild with cough only.   Objective: Vitals:   09/12/19 0400 09/12/19 0800 09/12/19 1105 09/12/19 1147  BP: 117/69 112/75 136/70   Pulse: 69 85 92   Resp: (!) 21 (!) 25 (!) 22   Temp: 97.9 F (36.6 C) 98 F (36.7 C) 98 F (36.7 C)   TempSrc: Oral Oral Oral   SpO2: 100% 100% 100% 98%  Weight:      Height:        Intake/Output Summary (Last 24 hours) at 09/12/2019 1243 Last data filed at 09/12/2019 1100 Gross per 24 hour  Intake 400 ml  Output 975 ml  Net -575 ml   Filed Weights   09/10/19 1500  Weight: 71.8 kg    Gen: 48 y.o. male in no distress  Pulm: Non-labored but tachypneic with supplemental oxygen, crackles diffusely without wheezes.  CV: Regular rate and rhythm. No murmur, rub, or gallop. No JVD, no pedal edema. GI: Abdomen soft, non-tender, non-distended, with normoactive bowel sounds. No organomegaly or masses felt. Ext: Warm, no deformities Skin: No rashes, lesions or ulcers Neuro: Alert and oriented. No focal neurological deficits. Psych: Judgement and insight appear normal. Mood & affect  appropriate.   Data Reviewed: I have personally reviewed following labs and imaging studies  CBC: Recent Labs  Lab 09/10/19 1634 09/11/19 0618 09/12/19 0010  WBC 21.1* 24.3* 25.7*  NEUTROABS 18.2* 21.1* 22.6*  HGB 11.5* 10.9* 11.7*  HCT 33.7* 32.5* 35.7*  MCV  92.8 94.2 96.7  PLT 278 255 204   Basic Metabolic Panel: Recent Labs  Lab 09/10/19 1634 09/11/19 0618 09/12/19 0010  NA 137 135 133*  K 3.2* 3.2* 4.2  CL 98 98 95*  CO2 29 29 27   GLUCOSE 194* 210* 116*  BUN 24* 24* 24*  CREATININE 0.83 0.73 0.85  CALCIUM 8.1* 7.8* 7.9*   GFR: Estimated Creatinine Clearance: 102.3 mL/min (by C-G formula based on SCr of 0.85 mg/dL). Liver Function Tests: Recent Labs  Lab 09/10/19 1634 09/11/19 0618 09/12/19 0010  AST 34 26 31  ALT 28 23 28   ALKPHOS 75 71 76  BILITOT 1.5* 1.7* 1.3*  PROT 6.2* 5.5* 5.7*  ALBUMIN 3.2* 3.1* 3.1*   No results for input(s): LIPASE, AMYLASE in the last 168 hours. No results for input(s): AMMONIA in the last 168 hours. Coagulation Profile: No results for input(s): INR, PROTIME in the last 168 hours. Cardiac Enzymes: No results for input(s): CKTOTAL, CKMB, CKMBINDEX, TROPONINI in the last 168 hours. BNP (last 3 results) No results for input(s): PROBNP in the last 8760 hours. HbA1C: No results for input(s): HGBA1C in the last 72 hours. CBG: Recent Labs  Lab 09/12/19 0800  GLUCAP 88   Lipid Profile: No results for input(s): CHOL, HDL, LDLCALC, TRIG, CHOLHDL, LDLDIRECT in the last 72 hours. Thyroid Function Tests: No results for input(s): TSH, T4TOTAL, FREET4, T3FREE, THYROIDAB in the last 72 hours. Anemia Panel: Recent Labs    09/11/19 0618 09/12/19 0010  FERRITIN 736* 578*   Urine analysis: No results found for: COLORURINE, APPEARANCEUR, LABSPEC, PHURINE, GLUCOSEU, HGBUR, BILIRUBINUR, KETONESUR, PROTEINUR, UROBILINOGEN, NITRITE, LEUKOCYTESUR Recent Results (from the past 240 hour(s))  MRSA PCR Screening     Status: None   Collection Time: 09/10/19  3:37 PM   Specimen: Nasal Mucosa; Nasopharyngeal  Result Value Ref Range Status   MRSA by PCR NEGATIVE NEGATIVE Final    Comment:        The GeneXpert MRSA Assay (FDA approved for NASAL specimens only), is one component of a comprehensive MRSA  colonization surveillance program. It is not intended to diagnose MRSA infection nor to guide or monitor treatment for MRSA infections. Performed at [facility], [address], [place], [place]       Radiology Studies: Dg Chest 1 View  Result Date: 09/10/2019 CLINICAL DATA:  Dyspnea EXAM: CHEST  1 VIEW COMPARISON:  September 09, 2019 FINDINGS: Multifocal airspace opacities are again noted, slightly improved from prior study. There is no pneumothorax. No large pleural effusion. The heart size is stable from prior study. IMPRESSION: Persistent but slightly improved multifocal airspace opacities. Electronically Signed   By: 13/08/2019 M.D.   On: 09/10/2019 16:03   Ct Angio Chest Pe W Or Wo Contrast  Result Date: 09/12/2019 CLINICAL DATA:  PE suspected, positive D-dimer EXAM: CT ANGIOGRAPHY CHEST WITH CONTRAST TECHNIQUE: Multidetector CT imaging of the chest was performed using the standard protocol during bolus administration of intravenous contrast. Multiplanar CT image reconstructions and MIPs were obtained to evaluate the vascular anatomy. CONTRAST:  40mL OMNIPAQUE IOHEXOL 350 MG/ML SOLN COMPARISON:  Chest radiograph, 09/10/2019 FINDINGS: Cardiovascular: Satisfactory opacification of the pulmonary arteries to the segmental level.  Positive examination for pulmonary embolism with segmental to subsegmental embolus present in the left lower lobe (series 5, image 82) as well as in the subsegmental vessels of the right lower lobe (series 5, image 87). Cardiomegaly with mild enlargement of the RV LV ratio, 1.1. No pericardial effusion. Mediastinum/Nodes: Multiple enlarged mediastinal lymph nodes, largest pretracheal node measuring 2.0 x 1.7 cm. 2.0 cm nodule of the left lobe of the thyroid. Trachea, and esophagus demonstrate no significant findings. Lungs/Pleura: There is extensive, confluent and consolidative ground-glass opacity bilaterally, predominantly subpleural  in distribution. No pleural effusion or pneumothorax. Upper Abdomen: No acute abnormality. Hepatic steatosis. Geographic arterial enhancement of the spleen. Musculoskeletal: No chest wall abnormality. No acute or significant osseous findings. Review of the MIP images confirms the above findings. IMPRESSION: 1. Positive examination for pulmonary embolism with segmental to subsegmental embolus present in the left lower lobe (series 5, image 82) as well as in the subsegmental vessels of the right lower lobe (series 5, image 87). 2. Cardiomegaly with mild enlargement of the RV LV ratio, 1.1, generally concerning for right heart strain despite relatively low and distal burden of embolus. Correlate with echocardiographic findings. 3. There is extensive, confluent and consolidative ground-glass opacity bilaterally, predominantly subpleural in distribution, consistent with multifocal infection, particularly COVID-19 given this appearance. 4. Multiple enlarged mediastinal and hilar lymph nodes, likely reactive. 5. Incidental note of a 2.0 cm nodule of the left lobe of the thyroid. Recommend nonemergent dedicated thyroid ultrasound on an outpatient basis when clinically appropriate. Ordering clinician was paged at the time of interpretation; final communication to be documented. Electronically Signed   By: Lauralyn PrimesAlex  Bibbey M.D.   On: 09/12/2019 11:20    Scheduled Meds: . Chlorhexidine Gluconate Cloth  6 each Topical Daily  . chlorpheniramine-HYDROcodone  5 mL Oral Q12H  . dexamethasone (DECADRON) injection  6 mg Intravenous Q24H  . enoxaparin (LOVENOX) injection  30 mg Subcutaneous NOW  . enoxaparin (LOVENOX) injection  1 mg/kg Subcutaneous Q12H  . insulin aspart  0-5 Units Subcutaneous QHS  . insulin aspart  0-9 Units Subcutaneous TID WC  . mouth rinse  15 mL Mouth Rinse BID  . vitamin C  500 mg Oral Daily  . zinc sulfate  220 mg Oral Daily   Continuous Infusions: . remdesivir 100 mg in NS 250 mL 100 mg (09/11/19  1650)     LOS: 2 days   Time spent: 35 minutes.  Tyrone Nineyan B Kaelie Henigan, MD [facility] [email or web address] 09/12/2019, 12:43 PM

## 2019-09-12 NOTE — Progress Notes (Signed)
*   Echocardiogram 2D Echocardiogram limited has been performed.  Darlina Sicilian M 09/12/2019, 2:35 PM

## 2019-09-12 NOTE — Progress Notes (Signed)
Patient updated wife on personal cell phone. Patient educated on self prone, pt states he is unable to self prone. Encouraged pt to turn side to side though out night and not lay supine. Patient verbalized understanding. Patient required encouragement to use IS and flutter valve. Patient desat when standing or stand and pivot to [facility]. Will continue with POC.

## 2019-09-13 LAB — GLUCOSE, CAPILLARY
Glucose-Capillary: 139 mg/dL — ABNORMAL HIGH (ref 70–99)
Glucose-Capillary: 141 mg/dL — ABNORMAL HIGH (ref 70–99)
Glucose-Capillary: 94 mg/dL (ref 70–99)

## 2019-09-13 LAB — CBC WITH DIFFERENTIAL/PLATELET
Abs Immature Granulocytes: 1.06 10*3/uL — ABNORMAL HIGH (ref 0.00–0.07)
Basophils Absolute: 0.1 10*3/uL (ref 0.0–0.1)
Basophils Relative: 0 %
Eosinophils Absolute: 0 10*3/uL (ref 0.0–0.5)
Eosinophils Relative: 0 %
HCT: 36.4 % — ABNORMAL LOW (ref 39.0–52.0)
Hemoglobin: 11.9 g/dL — ABNORMAL LOW (ref 13.0–17.0)
Immature Granulocytes: 4 %
Lymphocytes Relative: 3 %
Lymphs Abs: 0.8 10*3/uL (ref 0.7–4.0)
MCH: 31.6 pg (ref 26.0–34.0)
MCHC: 32.7 g/dL (ref 30.0–36.0)
MCV: 96.6 fL (ref 80.0–100.0)
Monocytes Absolute: 0.8 10*3/uL (ref 0.1–1.0)
Monocytes Relative: 3 %
Neutro Abs: 24.1 10*3/uL — ABNORMAL HIGH (ref 1.7–7.7)
Neutrophils Relative %: 90 %
Platelets: 168 10*3/uL (ref 150–400)
RBC: 3.77 MIL/uL — ABNORMAL LOW (ref 4.22–5.81)
RDW: 18.1 % — ABNORMAL HIGH (ref 11.5–15.5)
WBC: 26.9 10*3/uL — ABNORMAL HIGH (ref 4.0–10.5)
nRBC: 1 % — ABNORMAL HIGH (ref 0.0–0.2)

## 2019-09-13 LAB — COMPREHENSIVE METABOLIC PANEL
ALT: 33 U/L (ref 0–44)
AST: 31 U/L (ref 15–41)
Albumin: 3.2 g/dL — ABNORMAL LOW (ref 3.5–5.0)
Alkaline Phosphatase: 72 U/L (ref 38–126)
Anion gap: 13 (ref 5–15)
BUN: 17 mg/dL (ref 6–20)
CO2: 23 mmol/L (ref 22–32)
Calcium: 8.1 mg/dL — ABNORMAL LOW (ref 8.9–10.3)
Chloride: 102 mmol/L (ref 98–111)
Creatinine, Ser: 0.65 mg/dL (ref 0.61–1.24)
GFR calc Af Amer: >60 mL/min (ref >60)
GFR calc non Af Amer: >60 mL/min (ref >60)
Glucose, Bld: 117 mg/dL — ABNORMAL HIGH (ref 70–99)
Potassium: 4 mmol/L (ref 3.5–5.1)
Sodium: 138 mmol/L (ref 135–145)
Total Bilirubin: 2.1 mg/dL — ABNORMAL HIGH (ref 0.3–1.2)
Total Protein: 6 g/dL — ABNORMAL LOW (ref 6.5–8.1)

## 2019-09-13 LAB — HEMOGLOBIN A1C
Hgb A1c MFr Bld: 4.2 % — ABNORMAL LOW (ref 4.8–5.6)
Mean Plasma Glucose: 73.84 mg/dL

## 2019-09-13 LAB — FERRITIN: Ferritin: 518 ng/mL — ABNORMAL HIGH (ref 24–336)

## 2019-09-13 LAB — D-DIMER, QUANTITATIVE: D-Dimer, Quant: 5.12 ug/mL-FEU — ABNORMAL HIGH (ref 0.00–0.50)

## 2019-09-13 LAB — C-REACTIVE PROTEIN: CRP: 9.9 mg/dL — ABNORMAL HIGH (ref <1.0)

## 2019-09-13 NOTE — Progress Notes (Signed)
PROGRESS NOTE  REMER COUSE  YBO:[id number] DOB: Mar 13, 1971 DOA: 09/10/2019 PCP: Dr. Wilford Sports FP Brief Narrative: GIANN OBARA is a 48 y.o. male previously healthy male who presented to the [facility] on 11/10 with progressive shortness of breath, fatigue, poor per oral intake, and daily fevers after having tested positive for SARS-CoV-2 on 11/3. He was given 15L NRB supplemental oxygen, remdesivir, and decadron. Antibiotics initially given but stopped with low PCT. Admission was requested at [facility] and he was taken to the ICU requiring HHF O2, though with treatment was able to be weaned to 15L HFNC and transferred to PCU on 11/11. D-dimer was elevated and rising, prompting CTA chest which demonstrated segmental/subsegmental LLL pulmonary emboli with RV:LV 1.1 and confirmed diffuse significant GGOs consistent with severe covid pneumonia.  Assessment & Plan: Principal Problem:   Pneumonia due to COVID-19 virus Active Problems:   Acute respiratory failure with hypoxia ([facility])   Hypokalemia   Acute pulmonary embolism ([facility])   Hyperbilirubinemia  Acute hypoxic respiratory failure due to covid-19 pneumonia complicated by PE:  - Wean oxygen as tolerated, continues improvement. Would need to require 2L O2 or less when ambulating to be discharged.  - Continue remdesivir 11/10 - 11/14 - Continue steroids, CRP up significantly today though this is inconsistent with clinical trajectory. Will monitor again tomorrow.  - Continue airborne, contact precautions. PPE including surgical gown, gloves, cap, shoe covers, and CAPR used during this encounter in a negative pressure room.  - Check daily labs: CBC w/diff, CMP, d-dimer, ferritin, CRP - Maintain euvolemia/net negative.  - Avoid NSAIDs - Recommend proning and aggressive use of incentive spirometry.   Acute pulmonary embolism: No RV strain on echocardiogram - Start therapeutic-dose lovenox. Has trouble taking pills, so will delay transition to DOAC until  discharge/improved respiratory status. Will go with xarelto once discharged due to once a day dosing given his difficulty with pills and low GI bleeding risk. Discussed need for PCP follow up to continue this.  Hypokalemia: Resolved with replacement.  - Continue monitoring  Hyperbilirubinemia due to spherocytosis: Chronic. Hx cholecystectomy. Without other LFT elevations. - No target Tx indicated.  Neutrophilic leukocytosis: Possibly due to steroids. PCT not severely elevated, more consistent with covid infection.  - Monitor.   Hyperglycemia: Isolated occurrence. HbA1c 4.2% not consistent with DM and no evidence of steroid-induced hyperglycemia.  - Stop CBGs/SSI.  Disposition Plan: Home once hypoxia improves and remdesivir completed.  Consultants:   None. Spoke with radiology, Dr. Catha Gosselin by phone 11/12.  Procedures:   Echocardiogram 09/12/2019:   1. Left ventricular ejection fraction, by visual estimation, is 60 to 65%. The left ventricle has normal function. There is no left ventricular hypertrophy.  2. Left ventricular diastolic function could not be evaluated.  3. The left ventricle has no regional wall motion abnormalities.  4. Global right ventricle has normal systolic function.The right ventricular size is normal. No increase in right ventricular wall thickness.  5. Left atrial size was normal.  6. Right atrial size was normal.  7. Presence of pericardial fat pad.  8. The pericardial effusion is circumferential.  9. Trivial pericardial effusion is present. 10. The mitral valve is grossly normal. Mild mitral valve regurgitation. 11. The tricuspid valve is normal in structure. Tricuspid valve regurgitation is trivial. 12. The aortic valve is tricuspid. Aortic valve regurgitation is not visualized. No evidence of aortic valve sclerosis or stenosis. 13. The pulmonic valve was grossly normal. Pulmonic valve regurgitation is not visualized. 14. The interatrial septum  was not  assessed.  Antimicrobials:  Remdesivir 11/10 - 11/14   Ceftriaxone, azithromycin 11/10  Subjective: Breathing much better, down on oxygen this morning and up into chair. Pleuritic chest pain improved. No bleeding noted and no hx GI bleeding.  Objective: Vitals:   09/12/19 1147 09/12/19 1625 09/12/19 2112 09/13/19 0408  BP:  132/70 124/78 118/74  Pulse:  96  79  Resp:  (!) 22  18  Temp:  98.1 F (36.7 C) 97.7 F (36.5 C) 97.9 F (36.6 C)  TempSrc:  Oral Axillary Oral  SpO2: 98% 97%  98%  Weight:      Height:        Intake/Output Summary (Last 24 hours) at 09/13/2019 0920 Last data filed at 09/13/2019 0435 Gross per 24 hour  Intake 250.28 ml  Output 1875 ml  Net -1624.72 ml   Filed Weights   09/10/19 1500  Weight: 71.8 kg   Gen: 48 y.o. male in no distress Pulm: Nonlabored breathing 6L O2. Crackles diffusely. CV: Regular rate and rhythm. No murmur, rub, or gallop. No JVD, no dependent edema. GI: Abdomen soft, non-tender, non-distended, with normoactive bowel sounds.  Ext: Warm, no deformities Skin: No rashes, lesions or ulcers on visualized skin. Neuro: Alert and oriented. No focal neurological deficits. Psych: Judgement and insight appear fair. Mood euthymic & affect congruent. Behavior is appropriate.    Data Reviewed: I have personally reviewed following labs and imaging studies  CBC: Recent Labs  Lab 09/10/19 1634 09/11/19 0618 09/12/19 0010 09/13/19 0030  WBC 21.1* 24.3* 25.7* 26.9*  NEUTROABS 18.2* 21.1* 22.6* 24.1*  HGB 11.5* 10.9* 11.7* 11.9*  HCT 33.7* 32.5* 35.7* 36.4*  MCV 92.8 94.2 96.7 96.6  PLT 278 255 204 168   Basic Metabolic Panel: Recent Labs  Lab 09/10/19 1634 09/11/19 0618 09/12/19 0010 09/13/19 0030  NA 137 135 133* 138  K 3.2* 3.2* 4.2 4.0  CL 98 98 95* 102  CO2 29 29 27 23   GLUCOSE 194* 210* 116* 117*  BUN 24* 24* 24* 17  CREATININE 0.83 0.73 0.85 0.65  CALCIUM 8.1* 7.8* 7.9* 8.1*   GFR: Estimated Creatinine  Clearance: 108.7 mL/min (by C-G formula based on SCr of 0.65 mg/dL). Liver Function Tests: Recent Labs  Lab 09/10/19 1634 09/11/19 0618 09/12/19 0010 09/13/19 0030  AST 34 26 31 31   ALT 28 23 28  33  ALKPHOS 75 71 76 72  BILITOT 1.5* 1.7* 1.3* 2.1*  PROT 6.2* 5.5* 5.7* 6.0*  ALBUMIN 3.2* 3.1* 3.1* 3.2*   No results for input(s): LIPASE, AMYLASE in the last 168 hours. No results for input(s): AMMONIA in the last 168 hours. Coagulation Profile: No results for input(s): INR, PROTIME in the last 168 hours. Cardiac Enzymes: No results for input(s): CKTOTAL, CKMB, CKMBINDEX, TROPONINI in the last 168 hours. BNP (last 3 results) No results for input(s): PROBNP in the last 8760 hours. HbA1C: Recent Labs    09/13/19 0030  HGBA1C 4.2*   CBG: Recent Labs  Lab 09/12/19 0800 09/12/19 1219  GLUCAP 88 101*   Lipid Profile: No results for input(s): CHOL, HDL, LDLCALC, TRIG, CHOLHDL, LDLDIRECT in the last 72 hours. Thyroid Function Tests: No results for input(s): TSH, T4TOTAL, FREET4, T3FREE, THYROIDAB in the last 72 hours. Anemia Panel: Recent Labs    09/12/19 0010 09/13/19 0030  FERRITIN 578* 518*   Urine analysis: No results found for: COLORURINE, APPEARANCEUR, LABSPEC, PHURINE, GLUCOSEU, HGBUR, BILIRUBINUR, KETONESUR, PROTEINUR, UROBILINOGEN, NITRITE, LEUKOCYTESUR Recent Results (from the past 240  hour(s))  MRSA PCR Screening     Status: None   Collection Time: 09/10/19  3:37 PM   Specimen: Nasal Mucosa; Nasopharyngeal  Result Value Ref Range Status   MRSA by PCR NEGATIVE NEGATIVE Final    Comment:        The GeneXpert MRSA Assay (FDA approved for NASAL specimens only), is one component of a comprehensive MRSA colonization surveillance program. It is not intended to diagnose MRSA infection nor to guide or monitor treatment for MRSA infections. Performed at [facility], [place] [address], [place], [place] [address]       Radiology Studies: Ct  Angio Chest Pe W Or Wo Contrast  Result Date: 09/12/2019 CLINICAL DATA:  PE suspected, positive D-dimer EXAM: CT ANGIOGRAPHY CHEST WITH CONTRAST TECHNIQUE: Multidetector CT imaging of the chest was performed using the standard protocol during bolus administration of intravenous contrast. Multiplanar CT image reconstructions and MIPs were obtained to evaluate the vascular anatomy. CONTRAST:  87mL OMNIPAQUE IOHEXOL 350 MG/ML SOLN COMPARISON:  Chest radiograph, 09/10/2019 FINDINGS: Cardiovascular: Satisfactory opacification of the pulmonary arteries to the segmental level. Positive examination for pulmonary embolism with segmental to subsegmental embolus present in the left lower lobe (series 5, image 82) as well as in the subsegmental vessels of the right lower lobe (series 5, image 87). Cardiomegaly with mild enlargement of the RV LV ratio, 1.1. No pericardial effusion. Mediastinum/Nodes: Multiple enlarged mediastinal lymph nodes, largest pretracheal node measuring 2.0 x 1.7 cm. 2.0 cm nodule of the left lobe of the thyroid. Trachea, and esophagus demonstrate no significant findings. Lungs/Pleura: There is extensive, confluent and consolidative ground-glass opacity bilaterally, predominantly subpleural in distribution. No pleural effusion or pneumothorax. Upper Abdomen: No acute abnormality. Hepatic steatosis. Geographic arterial enhancement of the spleen. Musculoskeletal: No chest wall abnormality. No acute or significant osseous findings. Review of the MIP images confirms the above findings. IMPRESSION: 1. Positive examination for pulmonary embolism with segmental to subsegmental embolus present in the left lower lobe (series 5, image 82) as well as in the subsegmental vessels of the right lower lobe (series 5, image 87). 2. Cardiomegaly with mild enlargement of the RV LV ratio, 1.1, generally concerning for right heart strain despite relatively low and distal burden of embolus. Correlate with echocardiographic  findings. 3. There is extensive, confluent and consolidative ground-glass opacity bilaterally, predominantly subpleural in distribution, consistent with multifocal infection, particularly COVID-19 given this appearance. 4. Multiple enlarged mediastinal and hilar lymph nodes, likely reactive. 5. Incidental note of a 2.0 cm nodule of the left lobe of the thyroid. Recommend nonemergent dedicated thyroid ultrasound on an outpatient basis when clinically appropriate. Ordering clinician was paged at the time of interpretation; final communication to be documented. Electronically Signed   By: Eddie Candle M.D.   On: 09/12/2019 11:20    Scheduled Meds:  Chlorhexidine Gluconate Cloth  6 each Topical Daily   dexamethasone (DECADRON) injection  6 mg Intravenous Q24H   enoxaparin (LOVENOX) injection  1 mg/kg Subcutaneous Q12H   insulin aspart  0-5 Units Subcutaneous QHS   insulin aspart  0-9 Units Subcutaneous TID WC   mouth rinse  15 mL Mouth Rinse BID   vitamin C  500 mg Oral Daily   zinc sulfate  220 mg Oral Daily   Continuous Infusions:  remdesivir 100 mg in NS 250 mL Stopped (09/12/19 1657)     LOS: 3 days   Time spent: 35 minutes.  Patrecia Pour, MD [facility] [email or web address] 09/13/2019, 9:20 AM

## 2019-09-13 NOTE — TOC Initial Note (Signed)
Transition of Care [facility]) - Initial/Assessment Note    Patient Details  Name: Calvin Arnold MRN: [id number] Date of Birth: 03/11/1971  Transition of Care [facility]) CM/SW Contact:    Ninfa Meeker, RN Phone Number: 09/13/2019, 11:35 AM  Clinical Narrative:   Patient is a 48 y.o. male who presented to the [facility] on 11/10 with progressive shortness of breath, fatigue, poor per oral intake, and daily fevers after having tested positive for COVID on 11/3. He was given 15L NRB supplemental oxygen, remdesivir, and decadron. Antibiotics initially given but stopped with low PCT. Admission was requested at [facility] and he was taken to the ICU requiring HHF O2, though with treatment was able to be weaned to 15L HFNC and transferred to PCU on 11/11.Acute hypoxic respiratory failure due to [id number] pneumonia complicated by PE. Per MD, would need to require 2L O2 or less when ambulating to be discharged. Last Remdesivir dose is on 11/14, will remain on IV steroids. Case manager has arranged telephonic follow up appointment for patient on Wednesday, 09/25/19 at 9:10am. This appointment has been placed.                          Patient Goals and CMS Choice        Expected Discharge Plan and Services: to be determined                                                Prior Living Arrangements/Services                       Activities of Daily Living Home Assistive Devices/Equipment: None ADL Screening (condition at time of admission) Patient's cognitive ability adequate to safely complete daily activities?: Yes Is the patient deaf or have difficulty hearing?: No Does the patient have difficulty seeing, even when wearing glasses/contacts?: No Does the patient have difficulty concentrating, remembering, or making decisions?: No Patient able to express need for assistance with ADLs?: Yes Does the patient have difficulty dressing or bathing?: No Independently performs ADLs?: Yes  (appropriate for developmental age) Does the patient have difficulty walking or climbing stairs?: No Weakness of Legs: None Weakness of Arms/Hands: None  Permission Sought/Granted                  Emotional Assessment              Admission diagnosis:  covid 19 Patient Active Problem List   Diagnosis Date Noted  . Hypokalemia 09/12/2019  . Acute pulmonary embolism ([place]) 09/12/2019  . Hyperbilirubinemia 09/12/2019  . Pneumonia due to COVID-19 virus 09/10/2019  . Acute respiratory failure with hypoxia ([place]) 09/10/2019   PCP:  Patient, No Pcp Per Pharmacy:   [id number] - [place], [place] [address] [facility] [place] [address] Phone: [phone] Fax: [phone]     Social Determinants of Health (SDOH) Interventions    Readmission Risk Interventions No flowsheet data found.

## 2019-09-13 NOTE — Plan of Care (Signed)
?  Problem: Coping: ?Goal: Level of anxiety will decrease ?Outcome: Progressing ?  ?Problem: Safety: ?Goal: Ability to remain free from injury will improve ?Outcome: Progressing ?  ?

## 2019-09-14 ENCOUNTER — Other Ambulatory Visit: Payer: Self-pay

## 2019-09-14 ENCOUNTER — Encounter (HOSPITAL_COMMUNITY): Payer: Self-pay

## 2019-09-14 LAB — COMPREHENSIVE METABOLIC PANEL
ALT: 39 U/L (ref 0–44)
AST: 28 U/L (ref 15–41)
Albumin: 3.1 g/dL — ABNORMAL LOW (ref 3.5–5.0)
Alkaline Phosphatase: 67 U/L (ref 38–126)
Anion gap: 9 (ref 5–15)
BUN: 18 mg/dL (ref 6–20)
CO2: 22 mmol/L (ref 22–32)
Calcium: 8.2 mg/dL — ABNORMAL LOW (ref 8.9–10.3)
Chloride: 105 mmol/L (ref 98–111)
Creatinine, Ser: 0.66 mg/dL (ref 0.61–1.24)
GFR calc Af Amer: >60 mL/min (ref >60)
GFR calc non Af Amer: >60 mL/min (ref >60)
Glucose, Bld: 122 mg/dL — ABNORMAL HIGH (ref 70–99)
Potassium: 4.5 mmol/L (ref 3.5–5.1)
Sodium: 136 mmol/L (ref 135–145)
Total Bilirubin: 2.1 mg/dL — ABNORMAL HIGH (ref 0.3–1.2)
Total Protein: 6 g/dL — ABNORMAL LOW (ref 6.5–8.1)

## 2019-09-14 LAB — CBC WITH DIFFERENTIAL/PLATELET
Abs Immature Granulocytes: 0.95 10*3/uL — ABNORMAL HIGH (ref 0.00–0.07)
Basophils Absolute: 0.1 10*3/uL (ref 0.0–0.1)
Basophils Relative: 0 %
Eosinophils Absolute: 0 10*3/uL (ref 0.0–0.5)
Eosinophils Relative: 0 %
HCT: 36.3 % — ABNORMAL LOW (ref 39.0–52.0)
Hemoglobin: 12 g/dL — ABNORMAL LOW (ref 13.0–17.0)
Immature Granulocytes: 5 %
Lymphocytes Relative: 4 %
Lymphs Abs: 0.7 10*3/uL (ref 0.7–4.0)
MCH: 31.3 pg (ref 26.0–34.0)
MCHC: 33.1 g/dL (ref 30.0–36.0)
MCV: 94.5 fL (ref 80.0–100.0)
Monocytes Absolute: 0.5 10*3/uL (ref 0.1–1.0)
Monocytes Relative: 3 %
Neutro Abs: 17.1 10*3/uL — ABNORMAL HIGH (ref 1.7–7.7)
Neutrophils Relative %: 88 %
Platelets: 183 10*3/uL (ref 150–400)
RBC: 3.84 MIL/uL — ABNORMAL LOW (ref 4.22–5.81)
RDW: 17.4 % — ABNORMAL HIGH (ref 11.5–15.5)
WBC: 19.4 10*3/uL — ABNORMAL HIGH (ref 4.0–10.5)
nRBC: 0.6 % — ABNORMAL HIGH (ref 0.0–0.2)

## 2019-09-14 LAB — D-DIMER, QUANTITATIVE: D-Dimer, Quant: 2.7 ug/mL-FEU — ABNORMAL HIGH (ref 0.00–0.50)

## 2019-09-14 LAB — C-REACTIVE PROTEIN: CRP: 7.9 mg/dL — ABNORMAL HIGH (ref <1.0)

## 2019-09-14 LAB — FERRITIN: Ferritin: 532 ng/mL — ABNORMAL HIGH (ref 24–336)

## 2019-09-14 NOTE — Progress Notes (Signed)
PROGRESS NOTE  YUYA VANWINGERDEN  ONG:[id number] DOB: 1971-08-28 DOA: 09/10/2019 PCP: Dr. Wilford Sports FP Brief Narrative: QUARON DELACRUZ is a 48 y.o. male previously healthy male who presented to the [facility] on 11/10 with progressive shortness of breath, fatigue, poor per oral intake, and daily fevers after having tested positive for SARS-CoV-2 on 11/3. He was given 15L NRB supplemental oxygen, remdesivir, and decadron. Antibiotics initially given but stopped with low PCT. Admission was requested at [facility] and he was taken to the ICU requiring HHF O2, though with treatment was able to be weaned to 15L HFNC and transferred to PCU on 11/11. D-dimer was elevated and rising, prompting CTA chest which demonstrated segmental/subsegmental LLL pulmonary emboli with RV:LV 1.1 and confirmed diffuse significant GGOs consistent with severe covid pneumonia.  Assessment & Plan: Principal Problem:   Pneumonia due to COVID-19 virus Active Problems:   Acute respiratory failure with hypoxia (HCC)   Hypokalemia   Acute pulmonary embolism (HCC)   Hyperbilirubinemia  Acute hypoxic respiratory failure due to covid-19 pneumonia complicated by PE:  - Wean oxygen as tolerated, continues improvement, possibly liberating from O2 today. Check ambulatory pulse oximetry. - Continue remdesivir 11/10 - 11/14. Final dose this evening. - Continue steroids, CRP reassuringly improved today. D-dimer continues to trend down. - Continue airborne, contact precautions. PPE including surgical gown, gloves, cap, shoe covers, and CAPR used during this encounter in a negative pressure room.  - Check daily labs: CBC w/diff, CMP, d-dimer, ferritin, CRP - Maintain euvolemia/net negative.  - Avoid NSAIDs - Recommend proning and aggressive use of incentive spirometry.   Acute pulmonary embolism: No RV strain on echocardiogram - Continue lovenox. Hgb, Cr, and plt's stable. Has trouble taking pills, and currently tenuous respiratory status, so will  delay transition to DOAC until discharge/improved respiratory status. Plan to start xarelto due to once a day dosing given his difficulty with pills and low GI bleeding risk. Discussed need for PCP follow up to continue this. Has follow up scheduled for 11/25.  Hypokalemia: Resolved with replacement.  - Continue monitoring  Hyperbilirubinemia due to spherocytosis: Chronic. Hx cholecystectomy. Without other LFT elevations. - No target Tx indicated. Need to continue monitoring ALT.  Neutrophilic leukocytosis: Possibly due to steroids. PCT not severely elevated, more consistent with covid infection.  - Monitor.   Hyperglycemia: Isolated occurrence. HbA1c 4.2% not consistent with DM and no evidence of steroid-induced hyperglycemia.  - Stop CBGs/SSI.  Disposition Plan: Home once hypoxia improves and remdesivir completed. Likely 11/15 AM.  Consultants:   None. Spoke with radiology, Dr. Catha Gosselin by phone 11/12.  Procedures:   Echocardiogram 09/12/2019:   1. Left ventricular ejection fraction, by visual estimation, is 60 to 65%. The left ventricle has normal function. There is no left ventricular hypertrophy.  2. Left ventricular diastolic function could not be evaluated.  3. The left ventricle has no regional wall motion abnormalities.  4. Global right ventricle has normal systolic function.The right ventricular size is normal. No increase in right ventricular wall thickness.  5. Left atrial size was normal.  6. Right atrial size was normal.  7. Presence of pericardial fat pad.  8. The pericardial effusion is circumferential.  9. Trivial pericardial effusion is present. 10. The mitral valve is grossly normal. Mild mitral valve regurgitation. 11. The tricuspid valve is normal in structure. Tricuspid valve regurgitation is trivial. 12. The aortic valve is tricuspid. Aortic valve regurgitation is not visualized. No evidence of aortic valve sclerosis or stenosis. 13. The pulmonic valve  was  grossly normal. Pulmonic valve regurgitation is not visualized. 14. The interatrial septum was not assessed.  Antimicrobials:  Remdesivir 11/10 - 11/14   Ceftriaxone, azithromycin 11/10  Subjective: Just got back from going to the bathroom independently. Took oxygen off and felt only mild-moderate SOB. Was taken off O2 yesterday, placed back this AM but wonders if he can come off, will check today. Chest discomfort improving.   Objective: Vitals:   09/13/19 0900 09/13/19 2031 09/14/19 0529 09/14/19 0755  BP:  (!) 143/90 107/69 129/79  Pulse: 87 82  79  Resp: (!) 29 (!) 22 (!) 22   Temp:  97.8 F (36.6 C) (!) 97.2 F (36.2 C) (!) 97 F (36.1 C)  TempSrc:  Oral Oral Oral  SpO2: 96%  95% 90%  Weight:      Height:        Intake/Output Summary (Last 24 hours) at 09/14/2019 1106 Last data filed at 09/14/2019 0800 Gross per 24 hour  Intake 1360 ml  Output 3175 ml  Net -1815 ml   Filed Weights   09/10/19 1500  Weight: 71.8 kg   Gen: 48 y.o. male in no distress Pulm: Nonlabored breathing room air, crackles stable. . CV: Regular rate and rhythm. No murmur, rub, or gallop. No JVD, no dependent edema. GI: Abdomen soft, non-tender, non-distended, with normoactive bowel sounds.  Ext: Warm, no deformities Skin: No rashes, lesions or ulcers on visualized skin. Neuro: Alert and oriented. No focal neurological deficits. Psych: Judgement and insight appear fair. Mood euthymic & affect congruent. Behavior is appropriate.    Data Reviewed: I have personally reviewed following labs and imaging studies  CBC: Recent Labs  Lab 09/10/19 1634 09/11/19 0618 09/12/19 0010 09/13/19 0030 09/14/19 0136  WBC 21.1* 24.3* 25.7* 26.9* 19.4*  NEUTROABS 18.2* 21.1* 22.6* 24.1* 17.1*  HGB 11.5* 10.9* 11.7* 11.9* 12.0*  HCT 33.7* 32.5* 35.7* 36.4* 36.3*  MCV 92.8 94.2 96.7 96.6 94.5  PLT 278 255 204 168 183   Basic Metabolic Panel: Recent Labs  Lab 09/10/19 1634 09/11/19 0618  09/12/19 0010 09/13/19 0030 09/14/19 0136  NA 137 135 133* 138 136  K 3.2* 3.2* 4.2 4.0 4.5  CL 98 98 95* 102 105  CO2 29 29 27 23 22   GLUCOSE 194* 210* 116* 117* 122*  BUN 24* 24* 24* 17 18  CREATININE 0.83 0.73 0.85 0.65 0.66  CALCIUM 8.1* 7.8* 7.9* 8.1* 8.2*   GFR: Estimated Creatinine Clearance: 108.7 mL/min (by C-G formula based on SCr of 0.66 mg/dL). Liver Function Tests: Recent Labs  Lab 09/10/19 1634 09/11/19 0618 09/12/19 0010 09/13/19 0030 09/14/19 0136  AST 34 26 31 31 28   ALT 28 23 28  33 39  ALKPHOS 75 71 76 72 67  BILITOT 1.5* 1.7* 1.3* 2.1* 2.1*  PROT 6.2* 5.5* 5.7* 6.0* 6.0*  ALBUMIN 3.2* 3.1* 3.1* 3.2* 3.1*   No results for input(s): LIPASE, AMYLASE in the last 168 hours. No results for input(s): AMMONIA in the last 168 hours. Coagulation Profile: No results for input(s): INR, PROTIME in the last 168 hours. Cardiac Enzymes: No results for input(s): CKTOTAL, CKMB, CKMBINDEX, TROPONINI in the last 168 hours. BNP (last 3 results) No results for input(s): PROBNP in the last 8760 hours. HbA1C: Recent Labs    09/13/19 0030  HGBA1C 4.2*   CBG: Recent Labs  Lab 09/12/19 0800 09/12/19 1219 09/12/19 1647 09/12/19 2116 09/13/19 0829  GLUCAP 88 101* 139* 141* 94   Lipid Profile: No results for  input(s): CHOL, HDL, LDLCALC, TRIG, CHOLHDL, LDLDIRECT in the last 72 hours. Thyroid Function Tests: No results for input(s): TSH, T4TOTAL, FREET4, T3FREE, THYROIDAB in the last 72 hours. Anemia Panel: Recent Labs    09/13/19 0030 09/14/19 0136  FERRITIN 518* 532*   Urine analysis: No results found for: COLORURINE, APPEARANCEUR, LABSPEC, [place], GLUCOSEU, HGBUR, BILIRUBINUR, KETONESUR, PROTEINUR, UROBILINOGEN, NITRITE, LEUKOCYTESUR Recent Results (from the past 240 hour(s))  MRSA PCR Screening     Status: None   Collection Time: 09/10/19  3:37 PM   Specimen: Nasal Mucosa; Nasopharyngeal  Result Value Ref Range Status   MRSA by PCR NEGATIVE NEGATIVE  Final    Comment:        The GeneXpert MRSA Assay (FDA approved for NASAL specimens only), is one component of a comprehensive MRSA colonization surveillance program. It is not intended to diagnose MRSA infection nor to guide or monitor treatment for MRSA infections. Performed at [facility], [place] [address] [place], [place] [address]       Radiology Studies: Ct Angio Chest Pe W Or Wo Contrast  Result Date: 09/12/2019 CLINICAL DATA:  PE suspected, positive D-dimer EXAM: CT ANGIOGRAPHY CHEST WITH CONTRAST TECHNIQUE: Multidetector CT imaging of the chest was performed using the standard protocol during bolus administration of intravenous contrast. Multiplanar CT image reconstructions and MIPs were obtained to evaluate the vascular anatomy. CONTRAST:  25mL OMNIPAQUE IOHEXOL 350 MG/ML SOLN COMPARISON:  Chest radiograph, 09/10/2019 FINDINGS: Cardiovascular: Satisfactory opacification of the pulmonary arteries to the segmental level. Positive examination for pulmonary embolism with segmental to subsegmental embolus present in the left lower lobe (series 5, image 82) as well as in the subsegmental vessels of the right lower lobe (series 5, image 87). Cardiomegaly with mild enlargement of the RV LV ratio, 1.1. No pericardial effusion. Mediastinum/Nodes: Multiple enlarged mediastinal lymph nodes, largest pretracheal node measuring 2.0 x 1.7 cm. 2.0 cm nodule of the left lobe of the thyroid. Trachea, and esophagus demonstrate no significant findings. Lungs/Pleura: There is extensive, confluent and consolidative ground-glass opacity bilaterally, predominantly subpleural in distribution. No pleural effusion or pneumothorax. Upper Abdomen: No acute abnormality. Hepatic steatosis. Geographic arterial enhancement of the spleen. Musculoskeletal: No chest wall abnormality. No acute or significant osseous findings. Review of the MIP images confirms the above findings. IMPRESSION: 1. Positive  examination for pulmonary embolism with segmental to subsegmental embolus present in the left lower lobe (series 5, image 82) as well as in the subsegmental vessels of the right lower lobe (series 5, image 87). 2. Cardiomegaly with mild enlargement of the RV LV ratio, 1.1, generally concerning for right heart strain despite relatively low and distal burden of embolus. Correlate with echocardiographic findings. 3. There is extensive, confluent and consolidative ground-glass opacity bilaterally, predominantly subpleural in distribution, consistent with multifocal infection, particularly COVID-19 given this appearance. 4. Multiple enlarged mediastinal and hilar lymph nodes, likely reactive. 5. Incidental note of a 2.0 cm nodule of the left lobe of the thyroid. Recommend nonemergent dedicated thyroid ultrasound on an outpatient basis when clinically appropriate. Ordering clinician was paged at the time of interpretation; final communication to be documented. Electronically Signed   By: Eddie Candle M.D.   On: 09/12/2019 11:20    Scheduled Meds:  Chlorhexidine Gluconate Cloth  6 each Topical Daily   dexamethasone (DECADRON) injection  6 mg Intravenous Q24H   enoxaparin (LOVENOX) injection  1 mg/kg Subcutaneous Q12H   mouth rinse  15 mL Mouth Rinse BID   vitamin C  500 mg  Oral Daily   zinc sulfate  220 mg Oral Daily   Continuous Infusions:    LOS: 4 days   Time spent: 35 minutes.  Tyrone Nine, MD [facility] [email or web address] 09/14/2019, 11:05 AM

## 2019-09-15 DIAGNOSIS — E876 Hypokalemia: Secondary | ICD-10-CM

## 2019-09-15 DIAGNOSIS — I2699 Other pulmonary embolism without acute cor pulmonale: Secondary | ICD-10-CM

## 2019-09-15 LAB — COMPREHENSIVE METABOLIC PANEL
ALT: 42 U/L (ref 0–44)
AST: 26 U/L (ref 15–41)
Albumin: 3.4 g/dL — ABNORMAL LOW (ref 3.5–5.0)
Alkaline Phosphatase: 69 U/L (ref 38–126)
Anion gap: 9 (ref 5–15)
BUN: 18 mg/dL (ref 6–20)
CO2: 23 mmol/L (ref 22–32)
Calcium: 8.5 mg/dL — ABNORMAL LOW (ref 8.9–10.3)
Chloride: 105 mmol/L (ref 98–111)
Creatinine, Ser: 0.74 mg/dL (ref 0.61–1.24)
GFR calc Af Amer: >60 mL/min (ref >60)
GFR calc non Af Amer: >60 mL/min (ref >60)
Glucose, Bld: 108 mg/dL — ABNORMAL HIGH (ref 70–99)
Potassium: 4.2 mmol/L (ref 3.5–5.1)
Sodium: 137 mmol/L (ref 135–145)
Total Bilirubin: 1.5 mg/dL — ABNORMAL HIGH (ref 0.3–1.2)
Total Protein: 6.5 g/dL (ref 6.5–8.1)

## 2019-09-15 LAB — CBC WITH DIFFERENTIAL/PLATELET
Abs Immature Granulocytes: 0.96 10*3/uL — ABNORMAL HIGH (ref 0.00–0.07)
Basophils Absolute: 0.1 10*3/uL (ref 0.0–0.1)
Basophils Relative: 1 %
Eosinophils Absolute: 0.1 10*3/uL (ref 0.0–0.5)
Eosinophils Relative: 1 %
HCT: 39.8 % (ref 39.0–52.0)
Hemoglobin: 12.9 g/dL — ABNORMAL LOW (ref 13.0–17.0)
Immature Granulocytes: 5 %
Lymphocytes Relative: 5 %
Lymphs Abs: 0.9 10*3/uL (ref 0.7–4.0)
MCH: 30.6 pg (ref 26.0–34.0)
MCHC: 32.4 g/dL (ref 30.0–36.0)
MCV: 94.5 fL (ref 80.0–100.0)
Monocytes Absolute: 0.7 10*3/uL (ref 0.1–1.0)
Monocytes Relative: 4 %
Neutro Abs: 16.3 10*3/uL — ABNORMAL HIGH (ref 1.7–7.7)
Neutrophils Relative %: 84 %
Platelets: 243 10*3/uL (ref 150–400)
RBC: 4.21 MIL/uL — ABNORMAL LOW (ref 4.22–5.81)
RDW: 17.9 % — ABNORMAL HIGH (ref 11.5–15.5)
WBC: 19.1 10*3/uL — ABNORMAL HIGH (ref 4.0–10.5)
nRBC: 0.2 % (ref 0.0–0.2)

## 2019-09-15 LAB — C-REACTIVE PROTEIN: CRP: 3.2 mg/dL — ABNORMAL HIGH (ref <1.0)

## 2019-09-15 LAB — D-DIMER, QUANTITATIVE: D-Dimer, Quant: 2.01 ug/mL-FEU — ABNORMAL HIGH (ref 0.00–0.50)

## 2019-09-15 LAB — FERRITIN: Ferritin: 562 ng/mL — ABNORMAL HIGH (ref 24–336)

## 2019-09-15 MED ORDER — RIVAROXABAN (XARELTO) VTE STARTER PACK (15 & 20 MG): ORAL_TABLET | ORAL | 0 refills | Status: DC

## 2019-09-15 MED ORDER — RIVAROXABAN 20 MG PO TABS: 20.0000 mg | ORAL_TABLET | Freq: Every day | ORAL | Status: DC

## 2019-09-15 MED ORDER — DEXAMETHASONE 6 MG PO TABS: 6.0000 mg | ORAL_TABLET | Freq: Every day | ORAL | 0 refills | Status: AC

## 2019-09-15 MED ORDER — RIVAROXABAN 15 MG PO TABS
15.0000 mg | ORAL_TABLET | Freq: Two times a day (BID) | ORAL | Status: DC
  Administered 2019-09-15: 15 mg via ORAL
  Filled 2019-09-15 (×4): qty 1

## 2019-09-15 NOTE — TOC Transition Note (Addendum)
Transition of Care [facility]) - CM/SW Discharge Note   Patient Details  Name: Calvin Arnold MRN: [id number] Date of Birth: December 06, 1970  [facility]) CM/SW Contact:  Maryclare Labrador, RN Phone Number: 09/15/2019, 11:14 AM   Clinical Narrative:   Pt to discharge home today.  CM spoke with pt via phone, pt confirms he was independent from home PTA.  Bedside nurse confirms pt does not need home oxygen.  Pt will discharge home on Xarelto - CM contacted pharmacy of choice and verbally provided free 30 day benefit information for drug - pharmacist Teressa Senter confirms that benefit has been successfully applied to pts account and today's fill of drug will be at $0 cost to pt.  Pts steroid at discharge will cost approximately $11 - pt denied hardship with paying for steroid.  CM reiterated importance of making scheduled follow up appt with [facility] - CM requested pt to request medication assistance at that time to ensure he can continue to get Xarelto post initial 30 day period.  CM also printed medication assistance form for Xarelto to unit secretary - bedside nurse to retrieve and provide to pt prior to him leaving facility.   No other CM needs determined at this time - CM signing off    Final next level of care: Home/Self Care Barriers to Discharge: Barriers Resolved   Patient Goals and CMS Choice        Discharge Placement                       Discharge Plan and Services                                     Social Determinants of Health (SDOH) Interventions     Readmission Risk Interventions No flowsheet data found.

## 2019-09-15 NOTE — Discharge Summary (Signed)
Physician Discharge Summary  Calvin Arnold:[id number] DOB: 07/07/1971 DOA: 09/10/2019  PCP: Patient, No Pcp Per  Admit date: 09/10/2019 Discharge date: 09/15/2019  Admitted From: Home Disposition: Home   Recommendations for Outpatient Follow-up:  1. Follow up with PCP in 1-2 weeks 2. Please obtain BMP/CBC in one week 3. Continue xarelto for PE treatment at least 3-6 months.  Home Health: None Equipment/Devices: None, did not qualify for home oxygen Discharge Condition: Stable CODE STATUS: Full Diet recommendation: Heart healthy  Brief/Interim Summary: Calvin Arnold is a 48 y.o. male previously healthy male who presented to the [facility] on 11/10 with progressive shortness of breath, fatigue, poor per oral intake, and daily fevers after having tested positive for SARS-CoV-2 on 11/3. He was given 15L NRB supplemental oxygen, remdesivir, and decadron. Antibiotics initially given but stopped with low PCT. Admission was requested at [facility] and he was taken to the ICU requiring HHF O2, though with treatment was able to be weaned to 15L HFNC and transferred to PCU on 11/11. D-dimer was elevated and rising, prompting CTA chest which demonstrated segmental/subsegmental LLL pulmonary emboli with RV:LV 1.1 and confirmed diffuse significant GGOs consistent with severe covid pneumonia. No RV strain evident on echocardiogram. Lovenox was converted to xarelto and the patient slowly improved with resolution of hypoxia prior to discharge.  Discharge Diagnoses:  Principal Problem:   Pneumonia due to COVID-19 virus Active Problems:   Acute respiratory failure with hypoxia (HCC)   Hypokalemia   Acute pulmonary embolism (HCC)   Hyperbilirubinemia  Acute hypoxic respiratory failure due to covid-19 pneumonia complicated by PE:  - Weaned from oxygen  - Completed remdesivir 11/10 - 11/14.  - Continue steroids x10 days - Continue isolation for 21 days from date of first positive test.  - Recommend continued  use of incentive spirometry.   Acute pulmonary embolism: No RV strain on echocardiogram - Continue anticoagulation, converted to xarelto (on medicaid formulary and once a day dosing best for difficulty with pills) on 11/14, tolerating well. Pharmacy to give education prior to discharge. - Discussed need for PCP follow up to continue this. Has follow up scheduled for 11/25.  Hypokalemia: Resolved with replacement.   Hyperbilirubinemia due to spherocytosis: Chronic. Hx cholecystectomy. Without other LFT elevations. - No target Tx indicated.  Neutrophilic leukocytosis: Possibly due to steroids. PCT not severely elevated, more consistent with covid infection.  - Monitor.   Hyperglycemia: Isolated occurrence. HbA1c 4.2% not consistent with DM and no evidence of steroid-induced hyperglycemia.  Discharge Instructions Discharge Instructions    Diet - low sodium heart healthy   Complete by: As directed    Discharge instructions   Complete by: As directed    You are being discharged from the hospital after treatment for covid-19 infection. You are felt to be stable enough to no longer require inpatient monitoring, testing, and treatment, though you will need to follow the recommendations below: - Continue taking decadron for 5 more days - Due to blood clots in the lungs, you will need to continue taking the blood thinner xarelto as directed. The first 3 weeks dosing will be twice daily, then just once daily (take as directed).  - Remain in self-isolation for 21 days following your first positive test to reduce risk of transmission of the virus.  - Do not take NSAID medications (including, but not limited to, ibuprofen, advil, motrin, naproxen, aleve, goody's powder, etc.) - Follow up with your doctor in the next week via telehealth or seek medical attention  right away if your symptoms get WORSE.  - Consider donating plasma after you have recovered (either 14 days after a negative test or 28  days after symptoms have completely resolved) because your antibodies to this virus may be helpful to give to others with life-threatening infections. Please go to the website www.[email or web address] if you would like to consider volunteering for plasma donation.    Directions for you at home:  Wear a facemask You should wear a facemask that covers your nose and mouth when you are in the same room with other people and when you visit a healthcare provider. People who live with or visit you should also wear a facemask while they are in the same room with you.  Separate yourself from other people in your home As much as possible, you should stay in a different room from other people in your home. Also, you should use a separate bathroom, if available.  Avoid sharing household items You should not share dishes, drinking glasses, cups, eating utensils, towels, bedding, or other items with other people in your home. After using these items, you should wash them thoroughly with soap and water.  Cover your coughs and sneezes Cover your mouth and nose with a tissue when you cough or sneeze, or you can cough or sneeze into your sleeve. Throw used tissues in a lined trash can, and immediately wash your hands with soap and water for at least 20 seconds or use an alcohol-based hand rub.  Wash your [employer] your hands often and thoroughly with soap and water for at least 20 seconds. You can use an alcohol-based hand sanitizer if soap and water are not available and if your hands are not visibly dirty. Avoid touching your eyes, nose, and mouth with unwashed hands.  Directions for those who live with, or provide care at home for you:  Limit the number of people who have contact with the patient If possible, have only one caregiver for the patient. Other household members should stay in another home or place of residence. If this is not possible, they should stay in another room, or be separated from the  patient as much as possible. Use a separate bathroom, if available. Restrict visitors who do not have an essential need to be in the home.  Ensure good ventilation Make sure that shared spaces in the home have good air flow, such as from an air conditioner or an opened window, weather permitting.  Wash your hands often Wash your hands often and thoroughly with soap and water for at least 20 seconds. You can use an alcohol based hand sanitizer if soap and water are not available and if your hands are not visibly dirty. Avoid touching your eyes, nose, and mouth with unwashed hands. Use disposable paper towels to dry your hands. If not available, use dedicated cloth towels and replace them when they become wet.  Wear a facemask and gloves Wear a disposable facemask at all times in the room and gloves when you touch or have contact with the patient's blood, body fluids, and/or secretions or excretions, such as sweat, saliva, sputum, nasal mucus, vomit, urine, or feces.  Ensure the mask fits over your nose and mouth tightly, and do not touch it during use. Throw out disposable facemasks and gloves after using them. Do not reuse. Wash your hands immediately after removing your facemask and gloves. If your personal clothing becomes contaminated, carefully remove clothing and launder. Wash your hands after handling contaminated  clothing. Place all used disposable facemasks, gloves, and other waste in a lined container before disposing them with other household waste. Remove gloves and wash your hands immediately after handling these items.  Do not share dishes, glasses, or other household items with the patient Avoid sharing household items. You should not share dishes, drinking glasses, cups, eating utensils, towels, bedding, or other items with a patient who is confirmed to have, or being evaluated for, COVID-19 infection. After the person uses these items, you should wash them thoroughly with soap  and water.  Wash laundry thoroughly Immediately remove and wash clothes or bedding that have blood, body fluids, and/or secretions or excretions, such as sweat, saliva, sputum, nasal mucus, vomit, urine, or feces, on them. Wear gloves when handling laundry from the patient. Read and follow directions on labels of laundry or clothing items and detergent. In general, wash and dry with the warmest temperatures recommended on the label.  Clean all areas the individual has used often Clean all touchable surfaces, such as counters, tabletops, doorknobs, bathroom fixtures, toilets, phones, keyboards, tablets, and bedside tables, every day. Also, clean any surfaces that may have blood, body fluids, and/or secretions or excretions on them. Wear gloves when cleaning surfaces the patient has come in contact with. Use a diluted bleach solution (e.g., dilute bleach with 1 part bleach and 10 parts water) or a household disinfectant with a label that says EPA-registered for coronaviruses. To make a bleach solution at home, add 1 tablespoon of bleach to 1 quart (4 cups) of water. For a larger supply, add  cup of bleach to 1 gallon (16 cups) of water. Read labels of cleaning products and follow recommendations provided on product labels. Labels contain instructions for safe and effective use of the cleaning product including precautions you should take when applying the product, such as wearing gloves or eye protection and making sure you have good ventilation during use of the product. Remove gloves and wash hands immediately after cleaning.  Monitor yourself for signs and symptoms of illness Caregivers and household members are considered close contacts, should monitor their health, and will be asked to limit movement outside of the home to the extent possible. Follow the monitoring steps for close contacts listed on the symptom monitoring form.  If you have additional questions, contact your local health  department or call the epidemiologist on call at [phone] (available 24/7). This guidance is subject to change. For the most up-to-date guidance from [facility], please refer to their website: YouBlogs.pl   Increase activity slowly   Complete by: As directed    MyChart COVID-19 home monitoring program   Complete by: Sep 15, 2019    Is the patient willing to use the [place] for home monitoring?: Yes     Allergies as of 09/15/2019      Reactions   Onion Other (See Comments)   GI bloating      Medication List    TAKE these medications   acetaminophen 500 MG tablet Commonly known as: TYLENOL Take 1,000 mg by mouth every 6 (six) hours as needed for mild pain or fever.   benzonatate 200 MG capsule Commonly known as: TESSALON Take 200 mg by mouth 3 (three) times daily as needed.   dexamethasone 6 MG tablet Commonly known as: Decadron Take 1 tablet (6 mg total) by mouth daily for 5 days.   ibuprofen 200 MG tablet Commonly known as: ADVIL Take 400 mg by mouth every 6 (six) hours as  needed for fever or moderate pain.   Rivaroxaban 15 & 20 MG Tbpk Follow package directions: Take one 15mg  tablet by mouth twice a day. On day 22, switch to one 20mg  tablet once a day. Take with food.   vitamin C 500 MG tablet Commonly known as: ASCORBIC ACID Take 500 mg by mouth daily.      Follow-up Information    [facility] Follow up.   Why: You are scheduled for a telephonic hospital followup call on Wednesday, 09/25/19 @ 9:10am, please be available for the call. Should you have to reschedule please call [phone]   Contact information: [address], [address] [place] [phone]         Allergies  Allergen Reactions  . Onion Other (See Comments)    GI bloating    Consultations:  None  Procedures/Studies: Dg Chest 1 View  Result Date: 09/10/2019 CLINICAL DATA:   Dyspnea EXAM: CHEST  1 VIEW COMPARISON:  September 09, 2019 FINDINGS: Multifocal airspace opacities are again noted, slightly improved from prior study. There is no pneumothorax. No large pleural effusion. The heart size is stable from prior study. IMPRESSION: Persistent but slightly improved multifocal airspace opacities. Electronically Signed   By: Katherine Mantlehristopher  Green M.D.   On: 09/10/2019 16:03   Ct Angio Chest Pe W Or Wo Contrast  Result Date: 09/12/2019 CLINICAL DATA:  PE suspected, positive D-dimer EXAM: CT ANGIOGRAPHY CHEST WITH CONTRAST TECHNIQUE: Multidetector CT imaging of the chest was performed using the standard protocol during bolus administration of intravenous contrast. Multiplanar CT image reconstructions and MIPs were obtained to evaluate the vascular anatomy. CONTRAST:  75mL OMNIPAQUE IOHEXOL 350 MG/ML SOLN COMPARISON:  Chest radiograph, 09/10/2019 FINDINGS: Cardiovascular: Satisfactory opacification of the pulmonary arteries to the segmental level. Positive examination for pulmonary embolism with segmental to subsegmental embolus present in the left lower lobe (series 5, image 82) as well as in the subsegmental vessels of the right lower lobe (series 5, image 87). Cardiomegaly with mild enlargement of the RV LV ratio, 1.1. No pericardial effusion. Mediastinum/Nodes: Multiple enlarged mediastinal lymph nodes, largest pretracheal node measuring 2.0 x 1.7 cm. 2.0 cm nodule of the left lobe of the thyroid. Trachea, and esophagus demonstrate no significant findings. Lungs/Pleura: There is extensive, confluent and consolidative ground-glass opacity bilaterally, predominantly subpleural in distribution. No pleural effusion or pneumothorax. Upper Abdomen: No acute abnormality. Hepatic steatosis. Geographic arterial enhancement of the spleen. Musculoskeletal: No chest wall abnormality. No acute or significant osseous findings. Review of the MIP images confirms the above findings. IMPRESSION: 1. Positive  examination for pulmonary embolism with segmental to subsegmental embolus present in the left lower lobe (series 5, image 82) as well as in the subsegmental vessels of the right lower lobe (series 5, image 87). 2. Cardiomegaly with mild enlargement of the RV LV ratio, 1.1, generally concerning for right heart strain despite relatively low and distal burden of embolus. Correlate with echocardiographic findings. 3. There is extensive, confluent and consolidative ground-glass opacity bilaterally, predominantly subpleural in distribution, consistent with multifocal infection, particularly COVID-19 given this appearance. 4. Multiple enlarged mediastinal and hilar lymph nodes, likely reactive. 5. Incidental note of a 2.0 cm nodule of the left lobe of the thyroid. Recommend nonemergent dedicated thyroid ultrasound on an outpatient basis when clinically appropriate. Ordering clinician was paged at the time of interpretation; final communication to be documented. Electronically Signed   By: Lauralyn PrimesAlex  Bibbey M.D.   On: 09/12/2019 11:20    Subjective: Feels well,  no chest pain or dyspnea currently. Ready to go home.   Discharge Exam: Vitals:   09/14/19 2030 09/15/19 0734  BP: 138/70 111/73  Pulse: 92 92  Resp:    Temp: 98.2 F (36.8 C) (!) 97 F (36.1 C)  SpO2: 98% 97%   General: Pt is alert, awake, not in acute distress Cardiovascular: RRR, S1/S2 +, no rubs, no gallops Respiratory: CTA bilaterally, no wheezing, no rhonchi Abdominal: Soft, NT, ND, bowel sounds + Extremities: No edema, no cyanosis  Labs: BNP (last 3 results) No results for input(s): BNP in the last 8760 hours. Basic Metabolic Panel: Recent Labs  Lab 09/11/19 0618 09/12/19 0010 09/13/19 0030 09/14/19 0136 09/15/19 0346  NA 135 133* 138 136 137  K 3.2* 4.2 4.0 4.5 4.2  CL 98 95* 102 105 105  CO2 GLUCOSE 210* 116* 117* 122* 108*  BUN 24* 24* CREATININE 0.73 0.85 0.65 0.66 0.74  CALCIUM 7.8* 7.9* 8.1*  8.2* 8.5*   Liver Function Tests: Recent Labs  Lab 09/11/19 0618 09/12/19 0010 09/13/19 0030 09/14/19 0136 09/15/19 0346  AST ALT 23 28 33 39 42  ALKPHOS 71 76 72 67 69  BILITOT 1.7* 1.3* 2.1* 2.1* 1.5*  PROT 5.5* 5.7* 6.0* 6.0* 6.5  ALBUMIN 3.1* 3.1* 3.2* 3.1* 3.4*   No results for input(s): LIPASE, AMYLASE in the last 168 hours. No results for input(s): AMMONIA in the last 168 hours. CBC: Recent Labs  Lab 09/11/19 0618 09/12/19 0010 09/13/19 0030 09/14/19 0136 09/15/19 0346  WBC 24.3* 25.7* 26.9* 19.4* 19.1*  NEUTROABS 21.1* 22.6* 24.1* 17.1* 16.3*  HGB 10.9* 11.7* 11.9* 12.0* 12.9*  HCT 32.5* 35.7* 36.4* 36.3* 39.8  MCV 94.2 96.7 96.6 94.5 94.5  PLT 255 204 168 183 243   Cardiac Enzymes: No results for input(s): CKTOTAL, CKMB, CKMBINDEX, TROPONINI in the last 168 hours. BNP: Invalid input(s): POCBNP CBG: Recent Labs  Lab 09/12/19 0800 09/12/19 1219 09/12/19 1647 09/12/19 2116 09/13/19 0829  GLUCAP 88 101* 139* 141* 94   D-Dimer Recent Labs    09/14/19 0136 09/15/19 0346  DDIMER 2.70* 2.01*   Hgb A1c Recent Labs    09/13/19 0030  HGBA1C 4.2*   Lipid Profile No results for input(s): CHOL, HDL, LDLCALC, TRIG, CHOLHDL, LDLDIRECT in the last 72 hours. Thyroid function studies No results for input(s): TSH, T4TOTAL, T3FREE, THYROIDAB in the last 72 hours.  Invalid input(s): FREET3 Anemia work up Recent Labs    09/14/19 0136 09/15/19 0346  FERRITIN 532* 562*   Urinalysis No results found for: COLORURINE, APPEARANCEUR, LABSPEC, PHURINE, GLUCOSEU, HGBUR, BILIRUBINUR, KETONESUR, PROTEINUR, UROBILINOGEN, NITRITE, LEUKOCYTESUR  Microbiology Recent Results (from the past 240 hour(s))  MRSA PCR Screening     Status: None   Collection Time: 09/10/19  3:37 PM   Specimen: Nasal Mucosa; Nasopharyngeal  Result Value Ref Range Status   MRSA by PCR NEGATIVE NEGATIVE Final    Comment:        The GeneXpert MRSA Assay (FDA approved for  NASAL specimens only), is one component of a comprehensive MRSA colonization surveillance program. It is not intended to diagnose MRSA infection nor to guide or monitor treatment for MRSA infections. Performed at [facility], [address], [place], [place] [address]     Time coordinating discharge: Approximately 40 minutes  Tyrone Nine, MD  [facility] 09/15/2019, 10:40 AM

## 2019-09-15 NOTE — Discharge Instructions (Addendum)
Information on my medicine - XARELTO (rivaroxaban)  This medication education was reviewed with me or my healthcare representative as part of my discharge preparation.  The pharmacist that spoke with me during my hospital stay was:  Lavenia Atlas, [place]? Xarelto was prescribed to treat blood clots that may have been found in the veins of your legs (deep vein thrombosis) or in your lungs (pulmonary embolism) and to reduce the risk of them occurring again.  What do you need to know about Xarelto? The starting dose is one 15 mg tablet taken TWICE daily with food for the FIRST 21 DAYS then on (enter date)  10/06/2019  the dose is changed to one 20 mg tablet taken ONCE A DAY with your evening meal.  DO NOT stop taking Xarelto without talking to the health care provider who prescribed the medication.  Refill your prescription for 20 mg tablets before you run out.  After discharge, you should have regular check-up appointments with your healthcare provider that is prescribing your Xarelto.  In the future your dose may need to be changed if your kidney function changes by a significant amount.  What do you do if you miss a dose? If you are taking Xarelto TWICE DAILY and you miss a dose, take it as soon as you remember. You may take two 15 mg tablets (total 30 mg) at the same time then resume your regularly scheduled 15 mg twice daily the next day.  If you are taking Xarelto ONCE DAILY and you miss a dose, take it as soon as you remember on the same day then continue your regularly scheduled once daily regimen the next day. Do not take two doses of Xarelto at the same time.   Important Safety Information Xarelto is a blood thinner medicine that can cause bleeding. You should call your healthcare provider right away if you experience any of the following: ? Bleeding from an injury or your nose that does not stop. ? Unusual colored urine (red or dark  brown) or unusual colored stools (red or black). ? Unusual bruising for unknown reasons. ? A serious fall or if you hit your head (even if there is no bleeding).  Some medicines may interact with Xarelto and might increase your risk of bleeding while on Xarelto. To help avoid this, consult your healthcare provider or pharmacist prior to using any new prescription or non-prescription medications, including herbals, vitamins, non-steroidal anti-inflammatory drugs (NSAIDs) and supplements.  This website has more information on Xarelto: [email or web address]-[email or web address]

## 2019-09-15 NOTE — Plan of Care (Signed)

## 2019-09-15 NOTE — Progress Notes (Signed)
[place] for Lovenox ->Xarelto Indication: pulmonary embolus  Allergies  Allergen Reactions  . Onion Other (See Comments)    GI bloating    Patient Measurements: Height: 5' 7.5" (171.5 cm) Weight: 158 lb 4.6 oz (71.8 kg) IBW/kg (Calculated) : 67.25  Vital Signs: Temp: 98.2 F (36.8 C) (11/14 2030) Temp Source: Oral (11/14 2030) BP: 138/70 (11/14 2030) Pulse Rate: 92 (11/14 2030)  Labs: Recent Labs    09/13/19 0030 09/14/19 0136 09/15/19 0346  HGB 11.9* 12.0* 12.9*  HCT 36.4* 36.3* 39.8  PLT 168 183 243  CREATININE 0.65 0.66 0.74    Estimated Creatinine Clearance: 108.7 mL/min (by C-G formula based on SCr of 0.74 mg/dL).  Assessment: 28 YOM admitted with COVID-19 now diagnosed acute pulmonary embolism with mild heart strain. Pt currently on full-dose Lovenox. Last dose given last night ~2000. Now to transition to Xarelto.  CBC stable. No bleeding noted.  Goal of Therapy:  Treatment of PE Monitor platelets by anticoagulation protocol: Yes   Plan:  - D/c Lovenox - Xarelto 15mg  po BID x 21 days then 20mg  daily starting 12/6 - Monitor CBC, renal fx, and s/s of bleeding - Will educate patient prior to d/c  Sherlon Handing, PharmD, BCPS CGV Clinical pharmacist phone [phone] 09/15/2019 7:08 AM

## 2019-09-25 ENCOUNTER — Ambulatory Visit (INDEPENDENT_AMBULATORY_CARE_PROVIDER_SITE_OTHER): Payer: Self-pay | Admitting: Nurse Practitioner

## 2019-09-25 ENCOUNTER — Ambulatory Visit: Payer: Self-pay

## 2019-09-25 ENCOUNTER — Encounter (INDEPENDENT_AMBULATORY_CARE_PROVIDER_SITE_OTHER): Payer: Self-pay

## 2019-09-25 DIAGNOSIS — I2699 Other pulmonary embolism without acute cor pulmonale: Secondary | ICD-10-CM

## 2019-09-25 DIAGNOSIS — Z7689 Persons encountering health services in other specified circumstances: Secondary | ICD-10-CM

## 2019-09-25 DIAGNOSIS — J1289 Other viral pneumonia: Secondary | ICD-10-CM

## 2019-09-25 DIAGNOSIS — U071 COVID-19: Secondary | ICD-10-CM

## 2019-09-25 MED ORDER — RIVAROXABAN 20 MG PO TABS: 20.0000 mg | ORAL_TABLET | Freq: Every day | ORAL | 1 refills | Status: AC

## 2019-09-25 NOTE — Progress Notes (Signed)
Virtual Visit via Telephone Note Due to national recommendations of social distancing due to [place] 19, telehealth visit is felt to be most appropriate for this patient at this time.  I discussed the limitations, risks, security and privacy concerns of performing an evaluation and management service by telephone and the availability of in person appointments. I also discussed with the patient that there may be a patient responsible charge related to this service. The patient expressed understanding and agreed to proceed.    I connected with Calvin Arnold on 09/25/19  at   3:10 PM EST  EDT by telephone and verified that I am speaking with the correct person using two identifiers.   Consent I discussed the limitations, risks, security and privacy concerns of performing an evaluation and management service by telephone and the availability of in person appointments. I also discussed with the patient that there may be a patient responsible charge related to this service. The patient expressed understanding and agreed to proceed.   Location of Patient: Private Residence    Location of Provider: [place] and [place] participating in Telemedicine visit: Geryl Rankins FNP-BC [place]    History of Present Illness: Telemedicine visit for: Establish Care He has no significant PMH.   He had not been seen by a primary care provider in many years prior to his hospital admission on 09-10-2019 for COVID/PNA. Would use Urgent care for minor acute health related issues.  PER HOSPITAL NOTE:  Presented to the [facility] on 11/10 with progressive shortness of breath, fatigue, poor per oral intake, and daily fevers after having tested positive for SARS-CoV-2 on 11/3. He was given 15L NRB supplemental oxygen, remdesivir, and decadron. Antibiotics initially given but stopped with low PCT. He required ICU admission:HHF O2, though with treatment was able to be weaned to  15L HFNC and transferred to PCU on 11/11.  D-dimer was elevated and CTA chest demonstrated segmental/subsegmental LLL pulmonary emboli with RV:LV 1.1 and confirmed diffuse significant GGOs consistent with severe covid pneumonia. No RV strain evident on echocardiogram. Lovenox was converted to xarelto and the patient slowly improved with resolution of hypoxia prior to discharge.    Today he reports significantly improved symptoms however chest tightness and exertional dyspnea is still present. Does not require home oxygen.  He denies any worsening shortness of breath or chest pain.  He is aware that he will continue on Xarelto for the next 6 months. hypercoag panel was negative. CT angio of chest did show small 2cm nodule of the left lobe of the thyroid. Will need thyroid Ultrasound scheduled. [name] would like to wait to apply for financial assistance prior to scheduling this.   No past medical history on file.  Past Surgical History:  Procedure Laterality Date  . ERCP W/ SPHINCTEROTOMY AND BALLOON DILATION N/A 2007    Family History  Problem Relation Age of Onset  . Hyperlipidemia Father     Social History   Socioeconomic History  . Marital status: Married    Spouse name: Not on file  . Number of children: Not on file  . Years of education: Not on file  . Highest education level: Not on file  Occupational History  . Not on file  Social Needs  . Financial resource strain: Not on file  . Food insecurity    Worry: Not on file    Inability: Not on file  . Transportation needs    Medical:  Not on file    Non-medical: Not on file  Tobacco Use  . Smoking status: Never Smoker  . Smokeless tobacco: Never Used  Substance and Sexual Activity  . Alcohol use: Not on file  . Drug use: Not on file  . Sexual activity: Not on file  Lifestyle  . Physical activity    Days per week: 0 days    Minutes per session: 0 min  . Stress: To some extent  Relationships  . Social Wellsite geologist on phone: Not on file    Gets together: Not on file    Attends religious service: Not on file    Active member of club or organization: Not on file    Attends meetings of clubs or organizations: Not on file    Relationship status: Not on file  Other Topics Concern  . Not on file  Social History Narrative  . Not on file     Observations/Objective: Awake, alert and oriented x 3   Review of Systems  Constitutional: Negative for fever, malaise/fatigue and weight loss.  HENT: Negative.  Negative for nosebleeds.   Eyes: Negative.  Negative for blurred vision, double vision and photophobia.  Respiratory: Positive for shortness of breath (exertional). Negative for cough.   Cardiovascular: Negative.  Negative for chest pain, palpitations and leg swelling.       Chest tightness  Gastrointestinal: Negative.  Negative for heartburn, nausea and vomiting.  Musculoskeletal: Negative.  Negative for myalgias.  Neurological: Negative.  Negative for dizziness, focal weakness, seizures and headaches.  Psychiatric/Behavioral: Negative.  Negative for suicidal ideas.    Assessment and Plan:   Diagnoses and all orders for this visit:  Encounter to establish care  Acute pulmonary embolism, unspecified pulmonary embolism type, unspecified whether acute cor pulmonale present (HCC) -     rivaroxaban (XARELTO) 20 MG TABS tablet; Take 1 tablet (20 mg total) by mouth daily with supper.  Pneumonia due to COVID-19 virus Repeat CXR 6 weeks if symptomatic  Follow Up Instructions Return in about 3 weeks (around 10/16/2019).     I discussed the assessment and treatment plan with the patient. The patient was provided an opportunity to ask questions and all were answered. The patient agreed with the plan and demonstrated an understanding of the instructions.   The patient was advised to call back or seek an in-person evaluation if the symptoms worsen or if the condition fails to improve as  anticipated.  I provided 21 minutes of non-face-to-face time during this encounter including median intraservice time, reviewing previous notes, labs, imaging, medications and explaining diagnosis and management.  Claiborne Rigg, FNP-BC

## 2019-09-25 NOTE — Progress Notes (Signed)
Patient following up on [place] hospital stay. Patient states that he feels like he's getting better every day. Does still have fatigue, productive cough in the AM & chest soreness.  Is still taking a 15 mg tablet of Xarelto daily. No random bruising/bleeding.

## 2019-09-26 ENCOUNTER — Encounter (INDEPENDENT_AMBULATORY_CARE_PROVIDER_SITE_OTHER): Payer: Self-pay

## 2019-09-27 ENCOUNTER — Encounter (INDEPENDENT_AMBULATORY_CARE_PROVIDER_SITE_OTHER): Payer: Self-pay

## 2019-09-28 ENCOUNTER — Encounter (INDEPENDENT_AMBULATORY_CARE_PROVIDER_SITE_OTHER): Payer: Self-pay

## 2019-09-30 MED FILL — XARELTO 20 MG TABLET: 20 | 30 days supply | Qty: 30 | Fill #0

## 2019-10-09 ENCOUNTER — Other Ambulatory Visit: Payer: Self-pay

## 2019-10-09 DIAGNOSIS — I2699 Other pulmonary embolism without acute cor pulmonale: Secondary | ICD-10-CM

## 2019-10-09 DIAGNOSIS — J1282 Pneumonia due to coronavirus disease 2019: Secondary | ICD-10-CM

## 2019-10-09 DIAGNOSIS — U071 COVID-19: Secondary | ICD-10-CM

## 2019-10-09 MED FILL — XARELTO 20 MG TABLET: 20 | 30 days supply | Qty: 30 | Fill #0

## 2019-10-09 NOTE — Progress Notes (Signed)
Patient here for labs ordered during televisit. 

## 2019-10-10 LAB — CMP14+EGFR
ALT: 62 IU/L — ABNORMAL HIGH (ref 0–44)
AST: 40 IU/L (ref 0–40)
Albumin/Globulin Ratio: 2.2 (ref 1.2–2.2)
Albumin: 4.8 g/dL (ref 4.0–5.0)
Alkaline Phosphatase: 155 IU/L — ABNORMAL HIGH (ref 39–117)
BUN/Creatinine Ratio: 8 — ABNORMAL LOW (ref 9–20)
BUN: 6 mg/dL (ref 6–24)
Bilirubin Total: 1.8 mg/dL — ABNORMAL HIGH (ref 0.0–1.2)
CO2: 23 mmol/L (ref 20–29)
Calcium: 9.7 mg/dL (ref 8.7–10.2)
Chloride: 106 mmol/L (ref 96–106)
Creatinine, Ser: 0.76 mg/dL (ref 0.76–1.27)
GFR calc Af Amer: 126 mL/min/{1.73_m2} (ref >59)
GFR calc non Af Amer: 109 mL/min/{1.73_m2} (ref >59)
Globulin, Total: 2.2 g/dL (ref 1.5–4.5)
Glucose: 129 mg/dL — ABNORMAL HIGH (ref 65–99)
Potassium: 3.7 mmol/L (ref 3.5–5.2)
Sodium: 144 mmol/L (ref 134–144)
Total Protein: 7 g/dL (ref 6.0–8.5)

## 2019-10-10 LAB — CBC
Hematocrit: 42.1 % (ref 37.5–51.0)
Hemoglobin: 14.2 g/dL (ref 13.0–17.7)
MCH: 31.6 pg (ref 26.6–33.0)
MCHC: 33.7 g/dL (ref 31.5–35.7)
MCV: 94 fL (ref 79–97)
Platelets: 249 10*3/uL (ref 150–450)
RBC: 4.5 x10E6/uL (ref 4.14–5.80)
RDW: 18.6 % — ABNORMAL HIGH (ref 11.6–15.4)
WBC: 7.3 10*3/uL (ref 3.4–10.8)

## 2019-10-30 ENCOUNTER — Ambulatory Visit: Payer: Self-pay

## 2019-11-13 ENCOUNTER — Other Ambulatory Visit: Payer: Self-pay

## 2019-11-13 ENCOUNTER — Ambulatory Visit (INDEPENDENT_AMBULATORY_CARE_PROVIDER_SITE_OTHER): Payer: Self-pay | Admitting: Nurse Practitioner

## 2019-11-13 DIAGNOSIS — E041 Nontoxic single thyroid nodule: Secondary | ICD-10-CM

## 2019-11-13 DIAGNOSIS — R7989 Other specified abnormal findings of blood chemistry: Secondary | ICD-10-CM

## 2019-11-13 NOTE — Progress Notes (Signed)
Virtual Visit via Telephone Note Due to national recommendations of social distancing due to [place] 19, telehealth visit is felt to be most appropriate for this patient at this time.  I discussed the limitations, risks, security and privacy concerns of performing an evaluation and management service by telephone and the availability of in person appointments. I also discussed with the patient that there may be a patient responsible charge related to this service. The patient expressed understanding and agreed to proceed.    I connected with Calvin Arnold on 11/13/19  at   1:30 PM EST  EDT by telephone and verified that I am speaking with the correct person using two identifiers.   Consent I discussed the limitations, risks, security and privacy concerns of performing an evaluation and management service by telephone and the availability of in person appointments. I also discussed with the patient that there may be a patient responsible charge related to this service. The patient expressed understanding and agreed to proceed.   Location of Patient: Private Residence   Location of Provider: [place] and [place] participating in Telemedicine visit: Calvin Rankins FNP-BC [place]    History of Present Illness: Telemedicine visit for: Follow Up  has a past medical history of COVID-19.  Presented to the [facility] on 11/10 with progressive shortness of breath, fatigue, poor per oral intake, and daily fevers after having tested positive for SARS-CoV-2 on 11/3. He was given 15L NRB supplemental oxygen, remdesivir, and decadron. Antibiotics initially given but stopped with low PCT. He required ICU admission:HHF O2, though with treatment was able to be weaned to 15L HFNC and transferred to PCU on 11/11.  D-dimer was elevated and CTA chest demonstrated segmental/subsegmental LLL pulmonary emboli with RV:LV 1.1 and confirmed diffuse significant GGOs consistent  with severe covid pneumonia.No RV strain evident on echocardiogram. Lovenox was converted to xarelto and the patient slowly improved with resolution of hypoxia prior to discharge.  He is aware that he will continue on Xarelto for the next 6 months. hypercoag panel was negative. CT angio of chest did show small 2cm nodule of the left lobe of the thyroid. Will need thyroid Ultrasound scheduled.    Doing well today. Denies cough, chest pain, shortness of breath, nausea, vomiting, lightheadedness, dizziness, headaches or BLE edema. Will need to repeat LFTs as they were elevated in November. He does have a history of ERCP with sphincterectomy and balloon dilatation Lab Results  Component Value Date   ALT 62 (H) 10/09/2019   AST 40 10/09/2019   ALKPHOS 155 (H) 10/09/2019   BILITOT 1.8 (H) 10/09/2019      Past Medical History:  Diagnosis Date  . COVID-19     Past Surgical History:  Procedure Laterality Date  . ERCP W/ SPHINCTEROTOMY AND BALLOON DILATION N/A 2007    Family History  Problem Relation Age of Onset  . Hyperlipidemia Father     Social History   Socioeconomic History  . Marital status: Married    Spouse name: Not on file  . Number of children: Not on file  . Years of education: Not on file  . Highest education level: Not on file  Occupational History  . Not on file  Tobacco Use  . Smoking status: Never Smoker  . Smokeless tobacco: Never Used  Substance and Sexual Activity  . Alcohol use: Not on file  . Drug use: Not on file  . Sexual activity: Not on file  Other Topics Concern  . Not on file  Social History Narrative  . Not on file   Social Determinants of Health   Financial Resource Strain:   . Difficulty of Paying Living Expenses: Not on file  Food Insecurity:   . Worried About Charity fundraiser in the Last Year: Not on file  . Ran Out of Food in the Last Year: Not on file  Transportation Needs:   . Lack of Transportation (Medical): Not on file  .  Lack of Transportation (Non-Medical): Not on file  Physical Activity: Inactive  . Days of Exercise per Week: 0 days  . Minutes of Exercise per Session: 0 min  Stress: Stress Concern Present  . Feeling of Stress : To some extent  Social Connections:   . Frequency of Communication with Friends and Family: Not on file  . Frequency of Social Gatherings with Friends and Family: Not on file  . Attends Religious Services: Not on file  . Active Member of Clubs or Organizations: Not on file  . Attends Archivist Meetings: Not on file  . Marital Status: Not on file     Observations/Objective: Awake, alert and oriented x 3   Review of Systems  Constitutional: Negative for fever, malaise/fatigue and weight loss.  HENT: Negative.  Negative for nosebleeds.   Eyes: Negative.  Negative for blurred vision, double vision and photophobia.  Respiratory: Negative.  Negative for cough and shortness of breath.   Cardiovascular: Negative.  Negative for chest pain, palpitations and leg swelling.  Gastrointestinal: Negative.  Negative for heartburn, nausea and vomiting.  Musculoskeletal: Negative.  Negative for myalgias.  Neurological: Negative.  Negative for dizziness, focal weakness, seizures and headaches.  Psychiatric/Behavioral: Negative.  Negative for suicidal ideas.    Assessment and Plan: [name] was seen today for follow-up.  Diagnoses and all orders for this visit:  Thyroid nodule -     Cancel: US Soft Tissue Head/Neck; Future -     TSH; Future -     US THYROID; Future  Elevated LFTs -     Lipid Panel; Future -     CMP14+EGFR; Future     Follow Up Instructions Return in about 3 months (around 02/11/2020).     I discussed the assessment and treatment plan with the patient. The patient was provided an opportunity to ask questions and all were answered. The patient agreed with the plan and demonstrated an understanding of the instructions.   The patient was advised to call back  or seek an in-person evaluation if the symptoms worsen or if the condition fails to improve as anticipated.  I provided 15  minutes of non-face-to-face time during this encounter including median intraservice time, reviewing previous notes, labs, imaging, medications and explaining diagnosis and management.  Gildardo Pounds, FNP-BC

## 2019-11-15 ENCOUNTER — Telehealth: Payer: Self-pay

## 2019-11-15 MED FILL — XARELTO 20 MG TABLET: 20 | 30 days supply | Qty: 30 | Fill #1

## 2019-11-20 ENCOUNTER — Other Ambulatory Visit: Payer: Self-pay

## 2019-11-20 ENCOUNTER — Ambulatory Visit (HOSPITAL_COMMUNITY)
Admission: RE | Admit: 2019-11-20 | Discharge: 2019-11-20 | Disposition: A | Discharge status: Home / Self Care | Payer: Self-pay | Source: Ambulatory Visit | Attending: Nurse Practitioner | Admitting: Nurse Practitioner

## 2019-11-20 DIAGNOSIS — R7989 Other specified abnormal findings of blood chemistry: Secondary | ICD-10-CM

## 2019-11-20 DIAGNOSIS — E041 Nontoxic single thyroid nodule: Secondary | ICD-10-CM | POA: Insufficient documentation

## 2019-11-20 NOTE — Progress Notes (Signed)
Patient here for labs ordered during recent telehealth visit. 

## 2019-11-21 LAB — CMP14+EGFR
ALT: 73 IU/L — ABNORMAL HIGH (ref 0–44)
AST: 43 IU/L — ABNORMAL HIGH (ref 0–40)
Albumin/Globulin Ratio: 2.7 — ABNORMAL HIGH (ref 1.2–2.2)
Albumin: 4.9 g/dL (ref 4.0–5.0)
Alkaline Phosphatase: 101 IU/L (ref 39–117)
BUN/Creatinine Ratio: 8 — ABNORMAL LOW (ref 9–20)
BUN: 7 mg/dL (ref 6–24)
Bilirubin Total: 3 mg/dL — ABNORMAL HIGH (ref 0.0–1.2)
CO2: 26 mmol/L (ref 20–29)
Calcium: 9.4 mg/dL (ref 8.7–10.2)
Chloride: 106 mmol/L (ref 96–106)
Creatinine, Ser: 0.87 mg/dL (ref 0.76–1.27)
GFR calc Af Amer: 118 mL/min/{1.73_m2} (ref >59)
GFR calc non Af Amer: 102 mL/min/{1.73_m2} (ref >59)
Globulin, Total: 1.8 g/dL (ref 1.5–4.5)
Glucose: 96 mg/dL (ref 65–99)
Potassium: 4 mmol/L (ref 3.5–5.2)
Sodium: 145 mmol/L — ABNORMAL HIGH (ref 134–144)
Total Protein: 6.7 g/dL (ref 6.0–8.5)

## 2019-11-21 LAB — LIPID PANEL
Chol/HDL Ratio: 3.4 ratio (ref 0.0–5.0)
Cholesterol, Total: 109 mg/dL (ref 100–199)
HDL: 32 mg/dL — ABNORMAL LOW (ref >39)
LDL Chol Calc (NIH): 60 mg/dL (ref 0–99)
Triglycerides: 83 mg/dL (ref 0–149)
VLDL Cholesterol Cal: 17 mg/dL (ref 5–40)

## 2019-11-21 LAB — TSH: TSH: 1.42 u[IU]/mL (ref 0.450–4.500)

## 2019-11-30 ENCOUNTER — Other Ambulatory Visit: Payer: Self-pay | Admitting: Nurse Practitioner

## 2019-11-30 DIAGNOSIS — E041 Nontoxic single thyroid nodule: Secondary | ICD-10-CM

## 2019-11-30 DIAGNOSIS — R7989 Other specified abnormal findings of blood chemistry: Secondary | ICD-10-CM

## 2019-11-30 DIAGNOSIS — R17 Unspecified jaundice: Secondary | ICD-10-CM

## 2019-12-01 ENCOUNTER — Encounter: Payer: Self-pay | Admitting: Nurse Practitioner

## 2019-12-02 ENCOUNTER — Other Ambulatory Visit: Payer: Self-pay | Admitting: Nurse Practitioner

## 2019-12-11 MED FILL — XARELTO 20 MG TABLET: 20 | 30 days supply | Qty: 30 | Fill #2

## 2020-01-03 ENCOUNTER — Other Ambulatory Visit: Payer: Self-pay

## 2020-01-07 ENCOUNTER — Ambulatory Visit (INDEPENDENT_AMBULATORY_CARE_PROVIDER_SITE_OTHER): Payer: Self-pay | Admitting: Endocrinology

## 2020-01-07 ENCOUNTER — Encounter: Payer: Self-pay | Admitting: Endocrinology

## 2020-01-07 ENCOUNTER — Other Ambulatory Visit (HOSPITAL_COMMUNITY)
Admission: RE | Admit: 2020-01-07 | Discharge: 2020-01-07 | Disposition: A | Discharge status: Home / Self Care | Payer: Self-pay | Source: Ambulatory Visit | Attending: Endocrinology | Admitting: Endocrinology

## 2020-01-07 ENCOUNTER — Other Ambulatory Visit: Payer: Self-pay

## 2020-01-07 DIAGNOSIS — E042 Nontoxic multinodular goiter: Secondary | ICD-10-CM

## 2020-01-07 HISTORY — DX: Nontoxic multinodular goiter: E04.2

## 2020-01-07 NOTE — Patient Instructions (Signed)
We'll let you know about the biopsy results.  If as expected, no cancer is found, please come back for a follow-up appointment in 6 months  

## 2020-01-07 NOTE — Progress Notes (Signed)
Subjective:    Patient ID: Calvin Arnold, male    DOB: 26-Feb-1971, 49 y.o.   MRN: [id number]  HPI Pt is referred by Geryl Rankins, NP, for nodular thyroid.  Pt was incidentally noted to have a nodule at the thyroid in 2020.  He is unaware of ever having had thyroid problems in the past.  He has no h/o XRT or surgery to the neck.   Past Medical History:  Diagnosis Date  . COVID-19   . Multinodular goiter 01/07/2020    Past Surgical History:  Procedure Laterality Date  . ERCP W/ SPHINCTEROTOMY AND BALLOON DILATION N/A 2007    Social History   Socioeconomic History  . Marital status: Married    Spouse name: Not on file  . Number of children: Not on file  . Years of education: Not on file  . Highest education level: Not on file  Occupational History  . Not on file  Tobacco Use  . Smoking status: Never Smoker  . Smokeless tobacco: Never Used  Substance and Sexual Activity  . Alcohol use: Not on file  . Drug use: Not on file  . Sexual activity: Not on file  Other Topics Concern  . Not on file  Social History Narrative  . Not on file   Social Determinants of Health   Financial Resource Strain:   . Difficulty of Paying Living Expenses:   Food Insecurity:   . Worried About Charity fundraiser in the Last Year:   . Arboriculturist in the Last Year:   Transportation Needs:   . Film/video editor (Medical):   Marland Kitchen Lack of Transportation (Non-Medical):   Physical Activity: Inactive  . Days of Exercise per Week: 0 days  . Minutes of Exercise per Session: 0 min  Stress: Stress Concern Present  . Feeling of Stress : To some extent  Social Connections:   . Frequency of Communication with Friends and Family:   . Frequency of Social Gatherings with Friends and Family:   . Attends Religious Services:   . Active Member of Clubs or Organizations:   . Attends Archivist Meetings:   Marland Kitchen Marital Status:   Intimate Partner Violence:   . Fear of Current or Ex-Partner:   .  Emotionally Abused:   Marland Kitchen Physically Abused:   . Sexually Abused:     Current Outpatient Medications on File Prior to Visit  Medication Sig Dispense Refill  . Multiple Vitamin (MULTIVITAMIN) tablet Take 1 tablet by mouth daily.    . rivaroxaban (XARELTO) 20 MG TABS tablet Take 1 tablet (20 mg total) by mouth daily with supper. 90 tablet 1  . vitamin C (ASCORBIC ACID) 500 MG tablet Take 500 mg by mouth daily.    . Zinc Sulfate (ZINC 15 PO) Take 1 tablet by mouth daily.     No current facility-administered medications on file prior to visit.    Allergies  Allergen Reactions  . Onion Other (See Comments)    GI bloating    Family History  Problem Relation Age of Onset  . Hyperlipidemia Father   . Goiter Mother     BP 134/84 (BP Location: Left Arm, Patient Position: Sitting, Cuff Size: Normal)   Pulse 91   Ht 5' 7.5" (1.715 m)   Wt 189 lb (85.7 kg)   SpO2 97%   BMI 29.16 kg/m    Review of Systems Denies weight change, hoarseness, neck pain, sob, cough, dysphagia, diarrhea, flushing, and  cold intolerance.      Objective:   Physical Exam VS: see vs page GEN: no distress HEAD: head: no deformity eyes: no periorbital swelling, no proptosis external nose and ears are normal NECK: 3 cm left thyroid nodule is noted.  Freely mobile.  CHEST WALL: no deformity LUNGS: clear to auscultation CV: reg rate and rhythm, no murmur.  MUSCULOSKELETAL: muscle bulk and strength are grossly normal.  no obvious joint swelling.  gait is normal and steady EXTEMITIES: no deformity.  no edema PULSES: no carotid bruit NEURO:  cn 2-12 grossly intact.   readily moves all 4's.  sensation is intact to touch on all 4's SKIN:  Normal texture and temperature.  No rash or suspicious lesion is visible.   NODES:  None palpable at the neck PSYCH: alert, well-oriented.  Does not appear anxious nor depressed.    Lab Results  Component Value Date   TSH 1.420 11/20/2019   [place]: 3.8 cm left mid thyroid  nodule meets criteria for fine-needle sspiration.  1.0 cm right inferior thyroid nodule does not meet criteria for dedicated follow-up.  I have reviewed outside records, and summarized: Pt was noted to have MNG, and referred here.  Pt had chest CT for unrelated reason, and MNG was incidentally noted    thyroid needle bx: Location: left thyroid consent obtained, signed form on chart The area is first sprayed with cooling agent local: xylocaine 2%, with epinephrine prep: alcohol pad 4 bxs are done with 25 and 27g needles. no complications     Assessment & Plan:  MNG, new to me, uncertain etiology   Patient Instructions  We'll let you know about the biopsy results.  If as expected, no cancer is found, please come back for a follow-up appointment in 6 months.

## 2020-01-09 LAB — CYTOLOGY - NON PAP

## 2020-01-10 MED FILL — XARELTO 20 MG TABLET: 20 | 30 days supply | Qty: 30 | Fill #3

## 2020-02-11 MED FILL — XARELTO 20 MG TABLET: 20 | 30 days supply | Qty: 30 | Fill #4

## 2020-03-25 NOTE — Telephone Encounter (Signed)
CREATED IN ERROR

## 2020-04-16 ENCOUNTER — Other Ambulatory Visit: Payer: Self-pay | Admitting: *Deleted

## 2020-04-16 DIAGNOSIS — Z20822 Contact with and (suspected) exposure to covid-19: Secondary | ICD-10-CM

## 2020-04-17 LAB — NOVEL CORONAVIRUS, NAA: SARS-CoV-2, NAA: NOT DETECTED

## 2020-04-17 LAB — SARS-COV-2, NAA 2 DAY TAT

## 2020-04-23 ENCOUNTER — Other Ambulatory Visit: Payer: Self-pay | Admitting: *Deleted

## 2020-04-23 DIAGNOSIS — Z20822 Contact with and (suspected) exposure to covid-19: Secondary | ICD-10-CM

## 2020-04-24 LAB — SARS-COV-2, NAA 2 DAY TAT

## 2020-04-24 LAB — NOVEL CORONAVIRUS, NAA: SARS-CoV-2, NAA: NOT DETECTED

## 2020-05-14 ENCOUNTER — Other Ambulatory Visit: Payer: Self-pay | Admitting: *Deleted

## 2020-05-14 DIAGNOSIS — Z20822 Contact with and (suspected) exposure to covid-19: Secondary | ICD-10-CM

## 2020-05-15 LAB — NOVEL CORONAVIRUS, NAA: SARS-CoV-2, NAA: NOT DETECTED

## 2020-05-15 LAB — SARS-COV-2, NAA 2 DAY TAT

## 2020-05-20 IMAGING — CT CT ANGIO CHEST
1 of 4 series · 12 of 30 positions shown · IV contrast (omnipaque)
Comparison: Chest radiograph, 09/10/2019

CLINICAL DATA: PE suspected, positive D-dimer

EXAM:
CT ANGIOGRAPHY CHEST WITH CONTRAST
TECHNIQUE: Multidetector CT imaging of the chest was performed using the
standard protocol during bolus administration of intravenous
contrast. Multiplanar CT image reconstructions and MIPs were
obtained to evaluate the vascular anatomy.
CONTRAST:  75mL OMNIPAQUE IOHEXOL 350 MG/ML SOLN

[Series 4: pe chest · axial · 0.79mm/px · z∈[+250,+496]mm · 12 of 149 slices shown]
[im 13/149  lung]
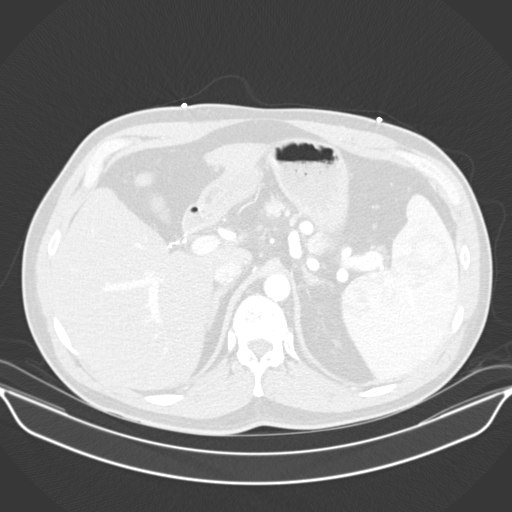
[im 25/149  mediastinal]
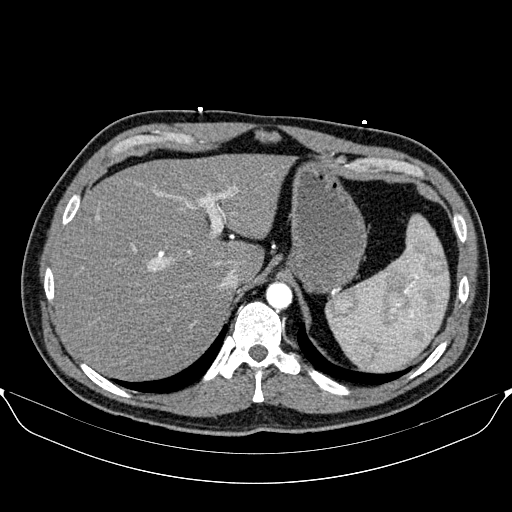
[im 38/149  lung]
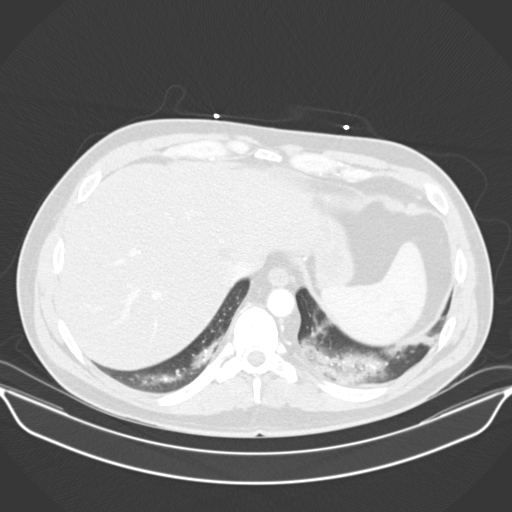
[im 50/149  mediastinal]
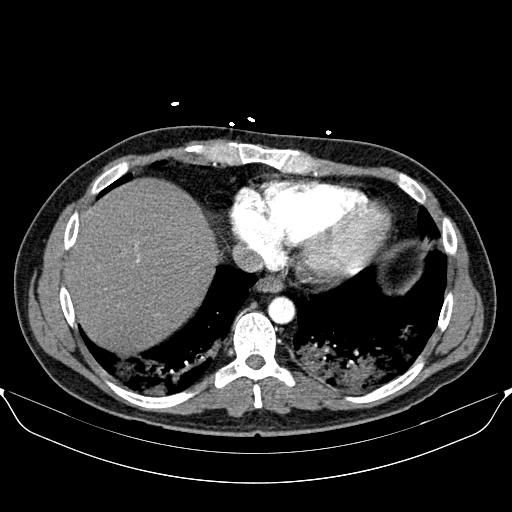
[im 62/149  lung]
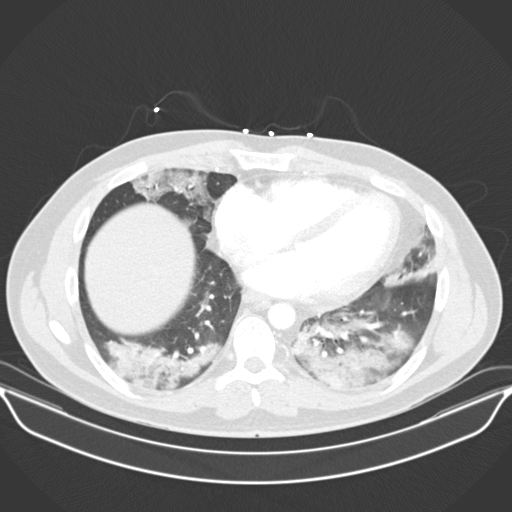
[im 70/149  mediastinal]
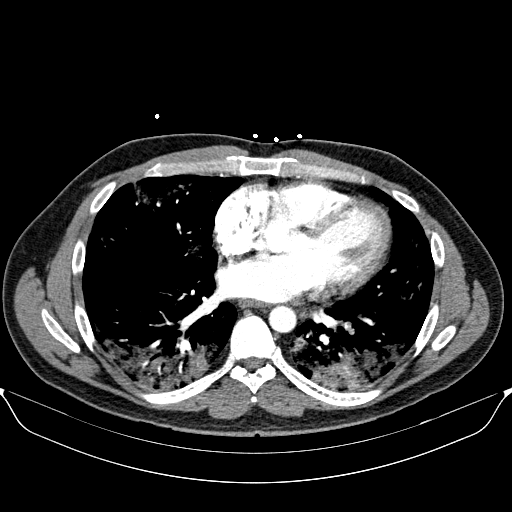
[im 75/149  lung]
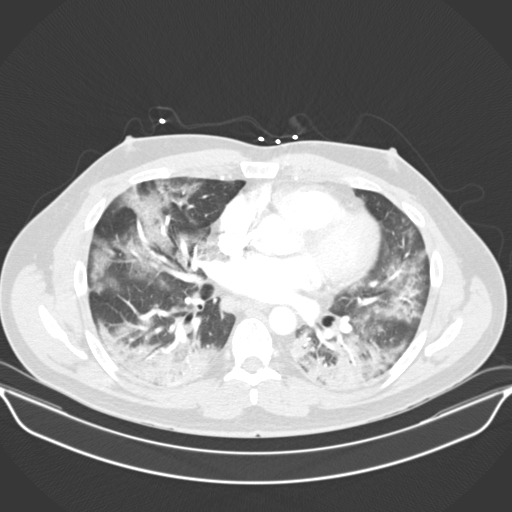
[im 87/149  mediastinal]
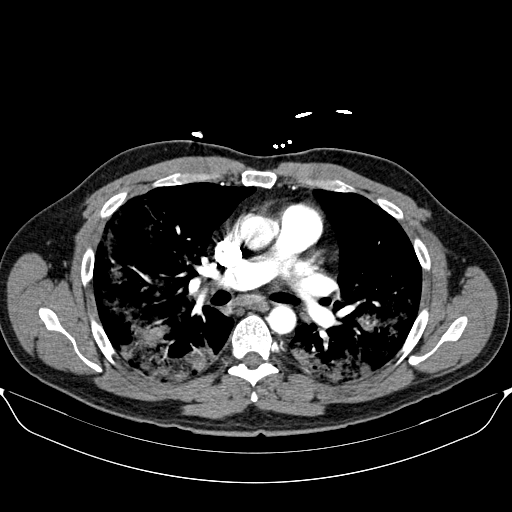
[im 99/149  lung]
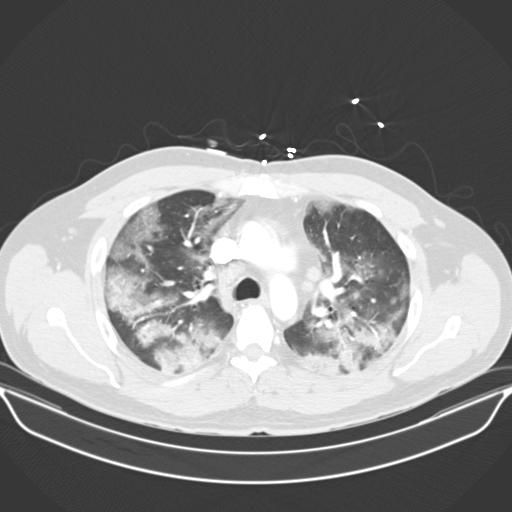
[im 112/149  mediastinal]
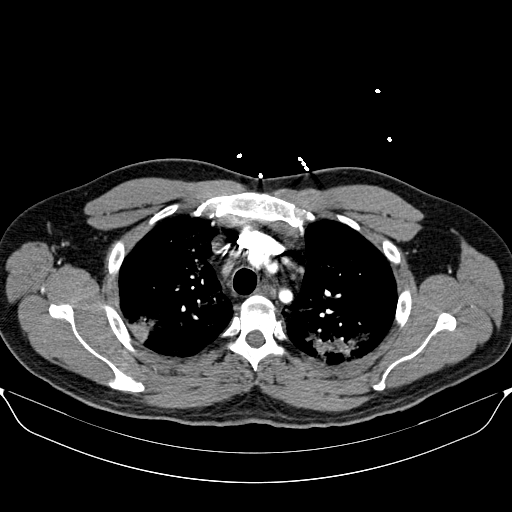
[im 124/149  lung]
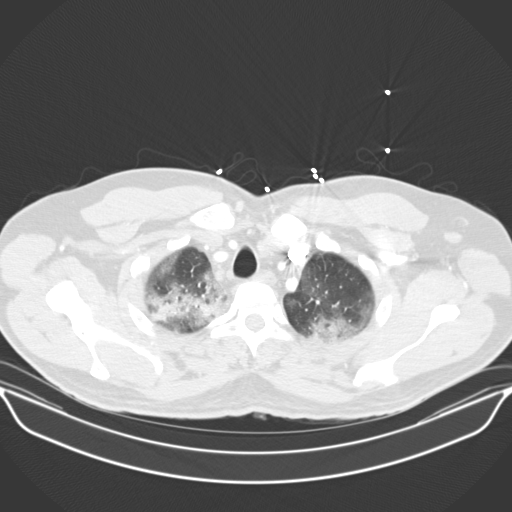
[im 136/149  mediastinal]
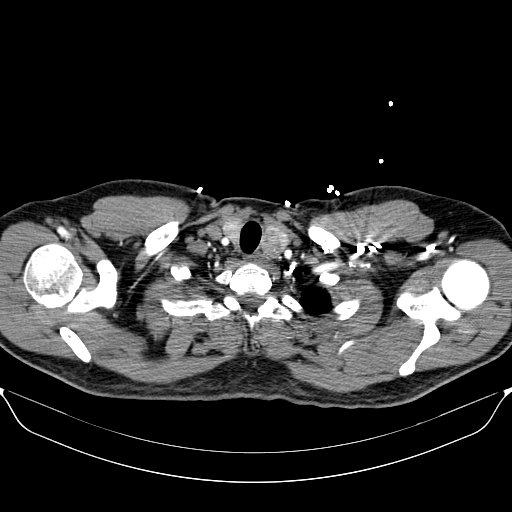

[12 of 30 positions shown; findings below may reference images not displayed]

FINDINGS: Cardiovascular: Satisfactory opacification of the pulmonary arteries
to the segmental level. Positive examination for pulmonary embolism
with segmental to subsegmental embolus present in the left lower
lobe (series 5, image 82) as well as in the subsegmental vessels of
the right lower lobe (series 5, image 87). Cardiomegaly with mild
enlargement of the RV LV ratio, 1.1. No pericardial effusion.

Mediastinum/Nodes: Multiple enlarged mediastinal lymph nodes,
largest pretracheal node measuring 2.0 x 1.7 cm. 2.0 cm nodule of
the left lobe of the thyroid. Trachea, and esophagus demonstrate no
significant findings.

Lungs/Pleura: There is extensive, confluent and consolidative
ground-glass opacity bilaterally, predominantly subpleural in
distribution. No pleural effusion or pneumothorax.

Upper Abdomen: No acute abnormality. Hepatic steatosis. Geographic
arterial enhancement of the spleen.

Musculoskeletal: No chest wall abnormality. No acute or significant
osseous findings.

Review of the MIP images confirms the above findings.
IMPRESSION: 1. Positive examination for pulmonary embolism with segmental to
subsegmental embolus present in the left lower lobe (series 5, image
82) as well as in the subsegmental vessels of the right lower lobe
(series 5, image 87).

2. Cardiomegaly with mild enlargement of the RV LV ratio, 1.1,
generally concerning for right heart strain despite relatively low
and distal burden of embolus. Correlate with echocardiographic
findings.

3. There is extensive, confluent and consolidative ground-glass
opacity bilaterally, predominantly subpleural in distribution,
consistent with multifocal infection, particularly [id number] given
this appearance.

4. Multiple enlarged mediastinal and hilar lymph nodes, likely
reactive.

5. Incidental note of a 2.0 cm nodule of the left lobe of the
thyroid. Recommend nonemergent dedicated thyroid ultrasound on an
outpatient basis when clinically appropriate.

Ordering clinician was paged at the time of interpretation; final
communication to be documented.

## 2020-05-21 ENCOUNTER — Other Ambulatory Visit: Payer: Self-pay

## 2020-05-21 DIAGNOSIS — Z20822 Contact with and (suspected) exposure to covid-19: Secondary | ICD-10-CM

## 2020-05-22 LAB — NOVEL CORONAVIRUS, NAA: SARS-CoV-2, NAA: NOT DETECTED

## 2020-05-22 LAB — SARS-COV-2, NAA 2 DAY TAT

## 2020-05-28 ENCOUNTER — Other Ambulatory Visit: Payer: Self-pay

## 2020-05-28 DIAGNOSIS — Z20822 Contact with and (suspected) exposure to covid-19: Secondary | ICD-10-CM

## 2020-05-29 LAB — SARS-COV-2, NAA 2 DAY TAT

## 2020-05-29 LAB — NOVEL CORONAVIRUS, NAA: SARS-CoV-2, NAA: NOT DETECTED

## 2020-05-29 LAB — SPECIMEN STATUS REPORT

## 2020-07-28 IMAGING — US US THYROID
1 series · 13 of 25 positions shown · non-contrast
Comparison: CTA of the chest on 09/12/2019

CLINICAL DATA: Incidental on CT. Left thyroid nodule detected by
prior CT.

EXAM:
THYROID ULTRASOUND
TECHNIQUE: Ultrasound examination of the thyroid gland and adjacent soft
tissues was performed.

[Series 1: us thyroid · 13 of 67 slices shown]
[im 1/67]
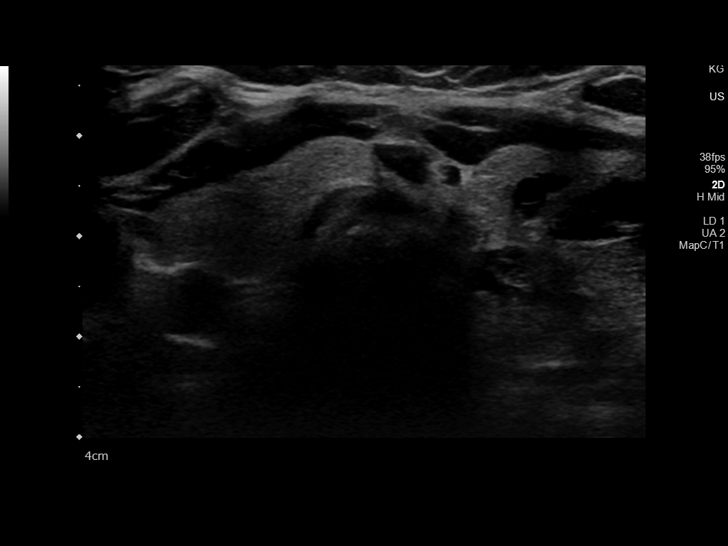
[im 6/67]
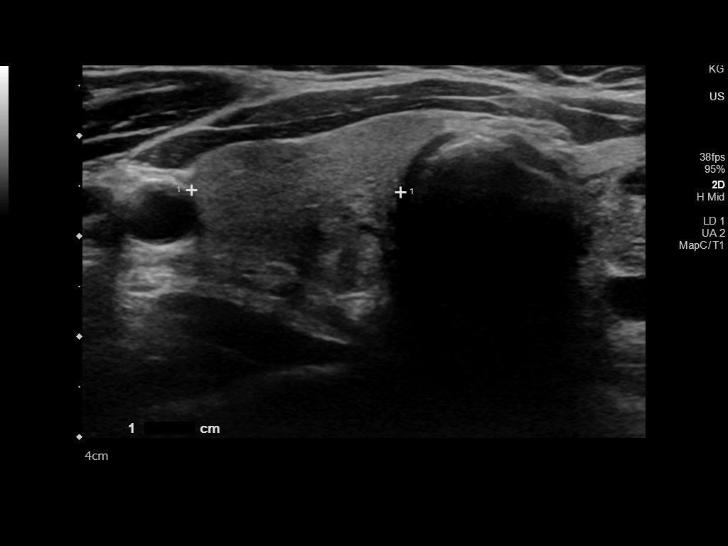
[im 12/67]
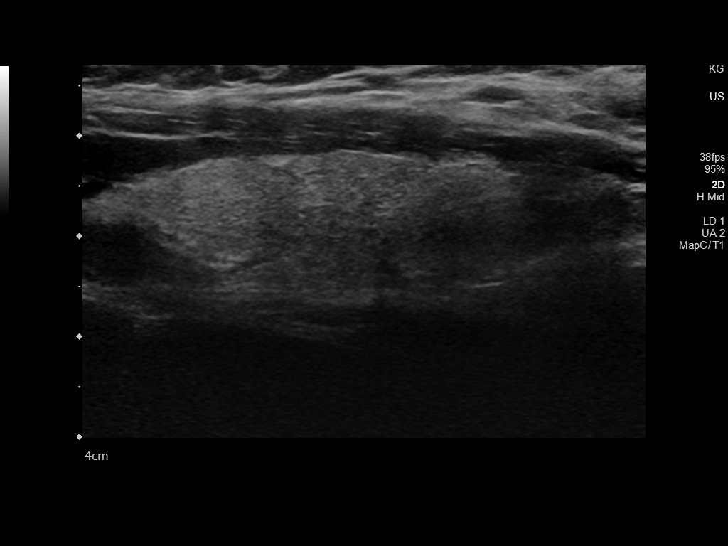
[im 17/67]
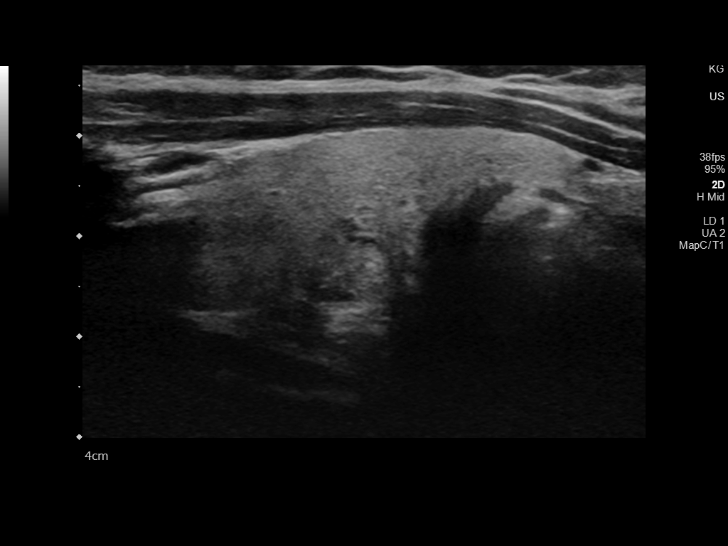
[im 23/67]
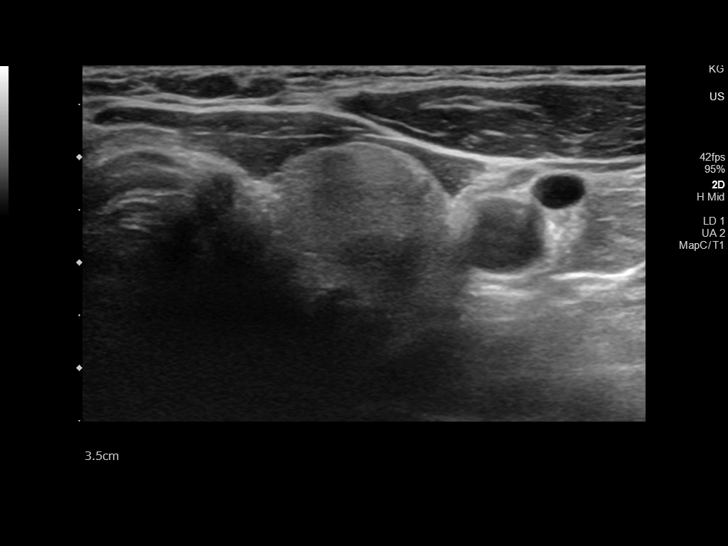
[im 28/67]
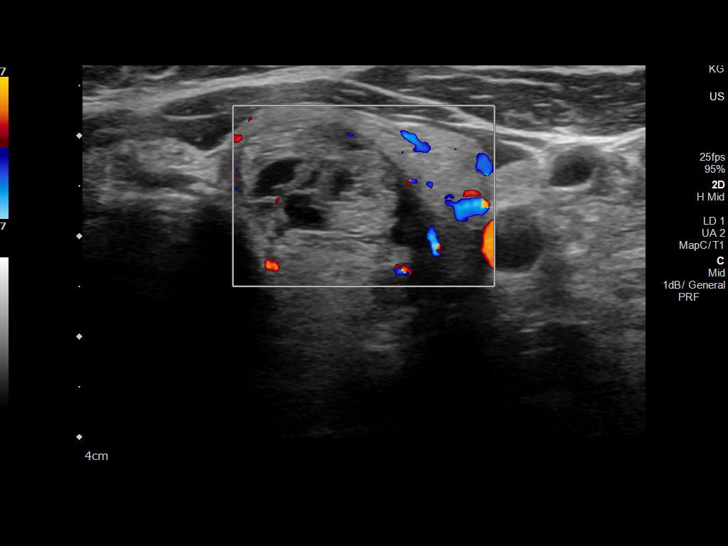
[im 34/67]
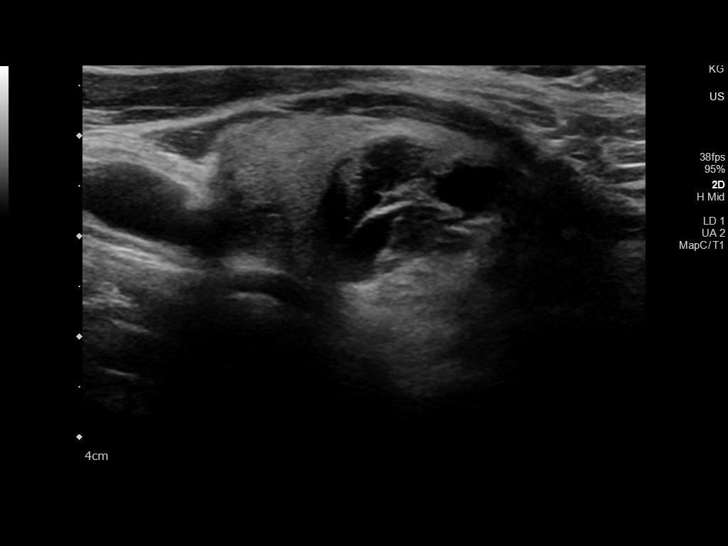
[im 39/67]
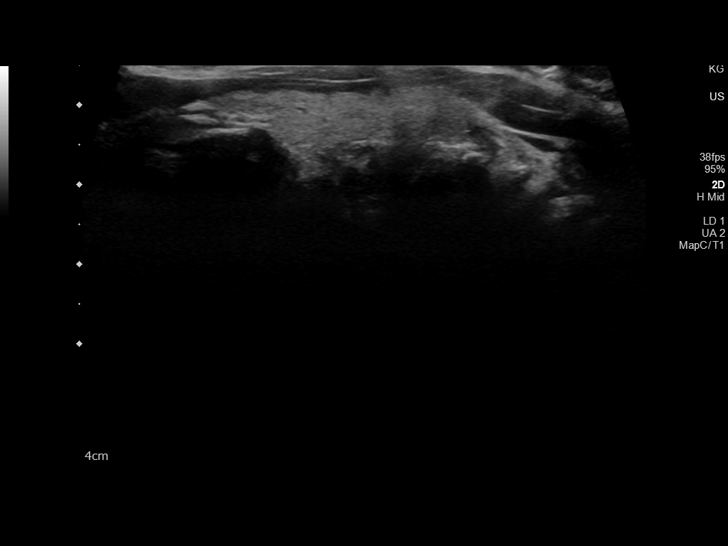
[im 45/67]
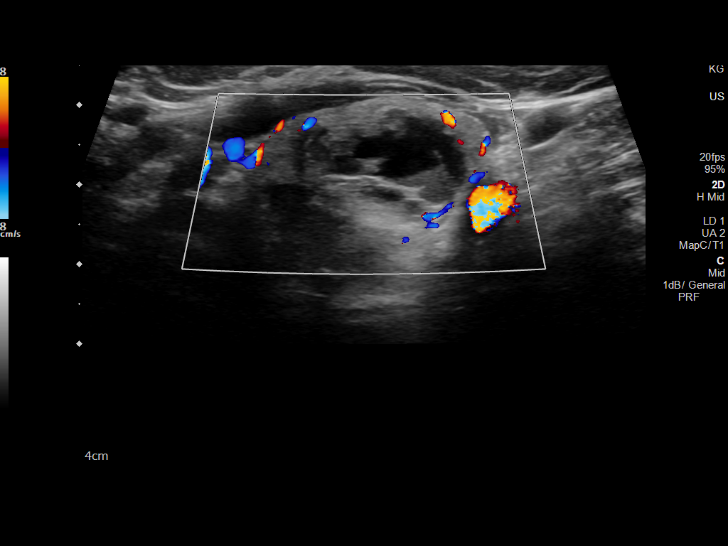
[im 50/67]
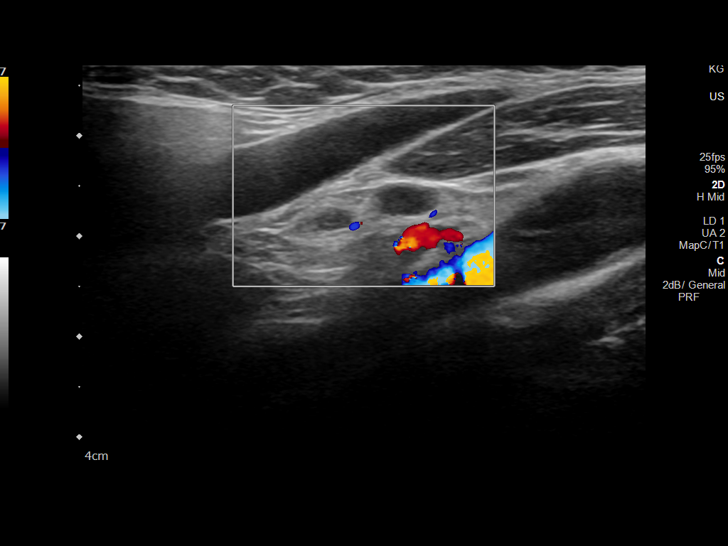
[im 56/67]
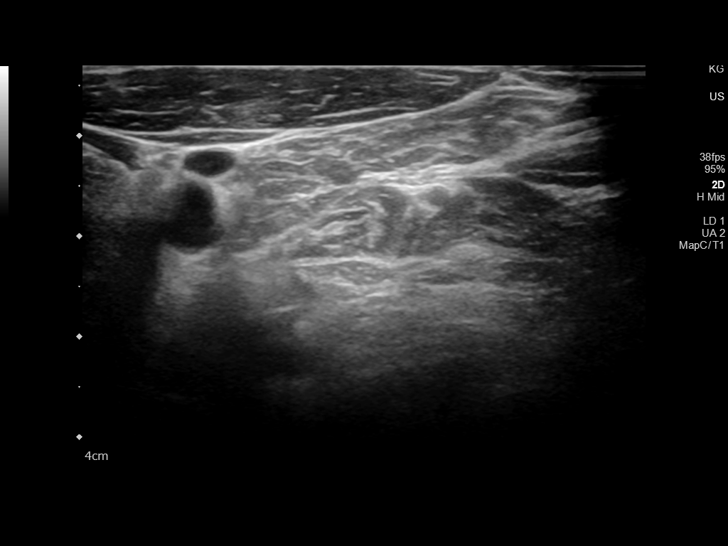
[im 61/67]
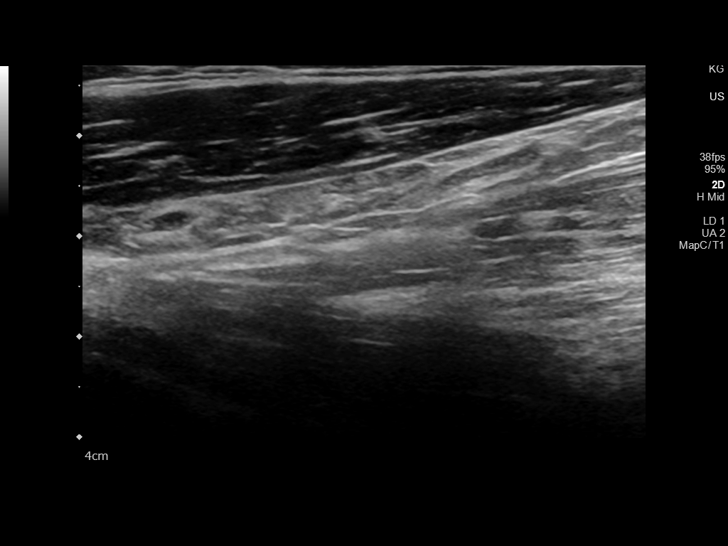
[im 67/67]
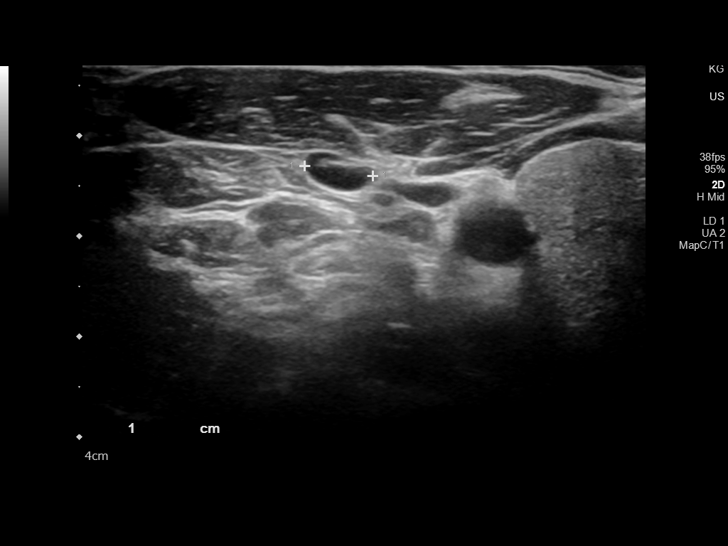

[13 of 25 positions shown; findings below may reference images not displayed]

FINDINGS: Parenchymal Echotexture: Mildly heterogenous

Isthmus: 0.5 cm

Right lobe: 4.9 x 1.8 x 2.1 cm

Left lobe: 5.3 x 2.6 x 3.0 cm

_________________________________________________________

Estimated total number of nodules >/= 1 cm: 2

Number of spongiform nodules >/=  2 cm not described below (TR1): 0

Number of mixed cystic and solid nodules >/= 1.5 cm not described
below (TR2): 0

_________________________________________________________

Nodule # 1:

Location: Right; Inferior

Maximum size: 1.0 cm; Other 2 dimensions: 1.0 x 1.0 cm

Composition: solid/almost completely solid (2)

Echogenicity: isoechoic (1)

Shape: not taller-than-wide (0)

Margins: ill-defined (0)

Echogenic foci: none (0)

ACR TI-RADS total points: 3.

ACR TI-RADS risk category: TR3 (3 points).

ACR TI-RADS recommendations:

Given size (<1.4 cm) and appearance, this nodule does NOT meet
TI-RADS criteria for biopsy or dedicated follow-up.

_________________________________________________________

Nodule # 2:

Location: Left; Mid

Maximum size: 3.8 cm; Other 2 dimensions: 2.5 x 2.8 cm

Composition: solid/almost completely solid (2)

Echogenicity: isoechoic (1)

Shape: not taller-than-wide (0)

Margins: ill-defined (0)

Echogenic foci: none (0)

ACR TI-RADS total points: 3.

ACR TI-RADS risk category: TR3 (3 points).

ACR TI-RADS recommendations:

**Given size (>/= 2.5 cm) and appearance, fine needle aspiration of
this mildly suspicious nodule should be considered based on TI-RADS
criteria.

_________________________________________________________

Nonenlarged left cervical lymph node measures 0.6 cm in short axis
and has a benign appearance.
IMPRESSION: 1. 3.8 cm left mid thyroid nodule meets criteria for fine-needle
aspiration.
2. 1.0 cm right inferior thyroid nodule does not meet criteria for
biopsy or dedicated follow-up.

The above is in keeping with the ACR TI-RADS recommendations - [HOSPITAL] 6603;[DATE].
